# Patient Record
Sex: Female | Born: 1950
Health system: Southern US, Community
[De-identification: ages and names within clinical notes are randomized; demographics above are authoritative.]

## PROBLEM LIST (undated history)

## (undated) DIAGNOSIS — E079 Disorder of thyroid, unspecified: Secondary | ICD-10-CM

## (undated) DIAGNOSIS — I10 Essential (primary) hypertension: Secondary | ICD-10-CM

## (undated) HISTORY — PX: ABDOMINAL HYSTERECTOMY: SHX81

---

## 2013-11-09 ENCOUNTER — Emergency Department (HOSPITAL_BASED_OUTPATIENT_CLINIC_OR_DEPARTMENT_OTHER): Payer: PRIVATE HEALTH INSURANCE

## 2013-11-09 ENCOUNTER — Encounter (HOSPITAL_BASED_OUTPATIENT_CLINIC_OR_DEPARTMENT_OTHER): Payer: Self-pay | Admitting: Emergency Medicine

## 2013-11-09 ENCOUNTER — Emergency Department (HOSPITAL_BASED_OUTPATIENT_CLINIC_OR_DEPARTMENT_OTHER)
Admission: EM | Admit: 2013-11-09 | Discharge: 2013-11-09 | Disposition: A | Discharge status: Home / Self Care | Payer: PRIVATE HEALTH INSURANCE | Attending: Emergency Medicine | Admitting: Emergency Medicine

## 2013-11-09 DIAGNOSIS — R110 Nausea: Secondary | ICD-10-CM | POA: Insufficient documentation

## 2013-11-09 DIAGNOSIS — K429 Umbilical hernia without obstruction or gangrene: Secondary | ICD-10-CM | POA: Insufficient documentation

## 2013-11-09 DIAGNOSIS — R1011 Right upper quadrant pain: Secondary | ICD-10-CM | POA: Insufficient documentation

## 2013-11-09 DIAGNOSIS — E079 Disorder of thyroid, unspecified: Secondary | ICD-10-CM | POA: Insufficient documentation

## 2013-11-09 DIAGNOSIS — I10 Essential (primary) hypertension: Secondary | ICD-10-CM | POA: Insufficient documentation

## 2013-11-09 DIAGNOSIS — Z79899 Other long term (current) drug therapy: Secondary | ICD-10-CM | POA: Insufficient documentation

## 2013-11-09 HISTORY — DX: Disorder of thyroid, unspecified: E07.9

## 2013-11-09 HISTORY — DX: Essential (primary) hypertension: I10

## 2013-11-09 LAB — URINALYSIS, ROUTINE W REFLEX MICROSCOPIC
Bilirubin Urine: NEGATIVE
Glucose, UA: NEGATIVE mg/dL
Ketones, ur: NEGATIVE mg/dL
NITRITE: NEGATIVE
PH: 6.5 (ref 5.0–8.0)
Protein, ur: NEGATIVE mg/dL
Specific Gravity, Urine: 1.019 (ref 1.005–1.030)
UROBILINOGEN UA: 0.2 mg/dL (ref 0.0–1.0)

## 2013-11-09 LAB — COMPREHENSIVE METABOLIC PANEL
ALBUMIN: 4.2 g/dL (ref 3.5–5.2)
ALK PHOS: 68 U/L (ref 39–117)
ALT: 15 U/L (ref 0–35)
AST: 23 U/L (ref 0–37)
BUN: 18 mg/dL (ref 6–23)
CHLORIDE: 98 meq/L (ref 96–112)
CO2: 27 mEq/L (ref 19–32)
Calcium: 9.4 mg/dL (ref 8.4–10.5)
Creatinine, Ser: 0.7 mg/dL (ref 0.50–1.10)
GFR calc Af Amer: >90 mL/min (ref >90)
GFR calc non Af Amer: 90 mL/min — ABNORMAL LOW (ref >90)
Glucose, Bld: 132 mg/dL — ABNORMAL HIGH (ref 70–99)
POTASSIUM: 3.6 meq/L — AB (ref 3.7–5.3)
Sodium: 138 mEq/L (ref 137–147)
TOTAL PROTEIN: 7.5 g/dL (ref 6.0–8.3)
Total Bilirubin: 0.3 mg/dL (ref 0.3–1.2)

## 2013-11-09 LAB — CBC WITH DIFFERENTIAL/PLATELET
BASOS ABS: 0 10*3/uL (ref 0.0–0.1)
BASOS PCT: 0 % (ref 0–1)
EOS PCT: 2 % (ref 0–5)
Eosinophils Absolute: 0.2 10*3/uL (ref 0.0–0.7)
HEMATOCRIT: 34.7 % — AB (ref 36.0–46.0)
Hemoglobin: 11.7 g/dL — ABNORMAL LOW (ref 12.0–15.0)
Lymphocytes Relative: 39 % (ref 12–46)
Lymphs Abs: 2.9 10*3/uL (ref 0.7–4.0)
MCH: 29 pg (ref 26.0–34.0)
MCHC: 33.7 g/dL (ref 30.0–36.0)
MCV: 85.9 fL (ref 78.0–100.0)
MONO ABS: 0.4 10*3/uL (ref 0.1–1.0)
Monocytes Relative: 5 % (ref 3–12)
Neutro Abs: 4 10*3/uL (ref 1.7–7.7)
Neutrophils Relative %: 53 % (ref 43–77)
Platelets: 228 10*3/uL (ref 150–400)
RBC: 4.04 MIL/uL (ref 3.87–5.11)
RDW: 13.5 % (ref 11.5–15.5)
WBC: 7.4 10*3/uL (ref 4.0–10.5)

## 2013-11-09 LAB — URINE MICROSCOPIC-ADD ON

## 2013-11-09 LAB — LIPASE, BLOOD: Lipase: 39 U/L (ref 11–59)

## 2013-11-09 MED ORDER — ONDANSETRON HCL 4 MG/2ML IJ SOLN
4.0000 mg | Freq: Once | INTRAMUSCULAR | Status: AC
  Administered 2013-11-09: 4 mg via INTRAVENOUS
  Filled 2013-11-09: qty 2

## 2013-11-09 MED ORDER — FENTANYL CITRATE 0.05 MG/ML IJ SOLN
75.0000 ug | Freq: Once | INTRAMUSCULAR | Status: AC
  Administered 2013-11-09: 75 ug via INTRAVENOUS
  Filled 2013-11-09: qty 2

## 2013-11-09 MED ORDER — SODIUM CHLORIDE 0.9 % IV BOLUS (SEPSIS)
1000.0000 mL | Freq: Once | INTRAVENOUS | Status: AC
  Administered 2013-11-09: 1000 mL via INTRAVENOUS

## 2013-11-09 MED ORDER — ONDANSETRON 4 MG PO TBDP: ORAL_TABLET | ORAL | Status: DC

## 2013-11-09 MED ORDER — HYDROCODONE-ACETAMINOPHEN 5-325 MG PO TABS: 2.0000 | ORAL_TABLET | ORAL | Status: DC | PRN

## 2013-11-09 NOTE — ED Provider Notes (Signed)
CSN: [id number]     Arrival date & time 11/09/13  4098 History   First MD Initiated Contact with Patient 11/09/13 [phone]     Chief Complaint  Patient presents with  . Abdominal Pain   (Consider location/radiation/quality/duration/timing/severity/associated sxs/prior Treatment) HPI Comments: 63 yo female with hysterectomy, cholecystectomy, HTN hx presents with acute RUQ pain radiating to her back since midnight, nausea.  No hx of similar.  No AAA hx.  No bleeding.  No injury.  Pain constant, worse with pressing. No hernia hx.  Ache sensation. No stone hx.   Patient is a 63 y.o. female presenting with abdominal pain. The history is provided by the patient.  Abdominal Pain Associated symptoms: nausea   Associated symptoms: no chest pain, no chills, no cough, no dysuria, no fever, no shortness of breath and no vomiting     Past Medical History  Diagnosis Date  . Hypertension   . Thyroid disease    Past Surgical History  Procedure Laterality Date  . Abdominal hysterectomy     No family history on file. History  Substance Use Topics  . Smoking status: Never Smoker   . Smokeless tobacco: Not on file  . Alcohol Use: Not on file   OB History   Grav Para Term Preterm Abortions TAB SAB Ect Mult Living                 Review of Systems  Constitutional: Negative for fever and chills.  HENT: Negative for congestion.   Eyes: Negative for visual disturbance.  Respiratory: Negative for cough and shortness of breath.   Cardiovascular: Negative for chest pain.  Gastrointestinal: Positive for nausea and abdominal pain. Negative for vomiting.  Genitourinary: Negative for dysuria and flank pain.  Musculoskeletal: Negative for back pain, neck pain and neck stiffness.  Skin: Negative for rash.  Neurological: Negative for light-headedness and headaches.    Allergies  Review of patient's allergies indicates no known allergies.  Home Medications   Current Outpatient Rx  Name  Route  Sig   Dispense  Refill  . hydrochlorothiazide (HYDRODIURIL) 25 MG tablet   Oral   Take 25 mg by mouth daily.         Marland Kitchen levothyroxine (SYNTHROID, LEVOTHROID) 50 MCG tablet   Oral   Take 50 mcg by mouth daily before breakfast.          BP 161/79  Pulse 87  Temp(Src) 97.7 F (36.5 C) (Oral)  Resp 18  SpO2 100% Physical Exam  Nursing note and vitals reviewed. Constitutional: She is oriented to person, place, and time. She appears well-developed and well-nourished.  HENT:  Head: Normocephalic and atraumatic.  Eyes: Conjunctivae are normal. Right eye exhibits no discharge. Left eye exhibits no discharge.  Neck: Normal range of motion. Neck supple. No tracheal deviation present.  Cardiovascular: Normal rate and regular rhythm.   Pulmonary/Chest: Effort normal and breath sounds normal.  Abdominal: Soft. She exhibits no distension. There is tenderness (right upper quadrant and lower right rib). There is no guarding.  Musculoskeletal: She exhibits no edema.  Neurological: She is alert and oriented to person, place, and time.  Skin: Skin is warm. No rash noted.  Psychiatric: She has a normal mood and affect.    ED Course  Procedures (including critical care time) EMERGENCY DEPARTMENT ULTRASOUND  Study: Limited Retroperitoneal Ultrasound of the Abdominal Aorta.  INDICATIONS:Abdominal pain, Back pain and Age>55 Multiple views of the abdominal aorta were obtained in real-time from the diaphragmatic hiatus to  the aortic bifurcation in transverse planes with a multi-frequency probe. PERFORMED BY: Myself IMAGES ARCHIVED?: Yes FINDINGS: Maximum aortic dimensions are 1.5 cm LIMITATIONS:  Bowel gas pain INTERPRETATION:  No abdominal aortic aneurysm   Emergency Focused Ultrasound Exam Limited retroperitoneal ultrasound of kidneys  Performed and interpreted by Dr. Jodi MourningZavitz Indication: flank pain Focused abdominal ultrasound with both kidneys imaged in transverse and longitudinal planes in  real-time. Interpretation: No hydronephrosis visualized.  No stones or cysts visualized  Images archived electronically  Labs Review Labs Reviewed  URINALYSIS, ROUTINE W REFLEX MICROSCOPIC - Abnormal; Notable for the following:    Hgb urine dipstick TRACE (*)    Leukocytes, UA SMALL (*)    All other components within normal limits  COMPREHENSIVE METABOLIC PANEL - Abnormal; Notable for the following:    Potassium 3.6 (*)    Glucose, Bld 132 (*)    GFR calc non Af Amer 90 (*)    All other components within normal limits  CBC WITH DIFFERENTIAL - Abnormal; Notable for the following:    Hemoglobin 11.7 (*)    HCT 34.7 (*)    All other components within normal limits  URINE MICROSCOPIC-ADD ON - Abnormal; Notable for the following:    Bacteria, UA FEW (*)    All other components within normal limits  LIPASE, BLOOD   Imaging Review Ct Abdomen Pelvis W Contrast  11/09/2013   CLINICAL DATA:  Right upper quadrant abdominal pain radiating to the back that began yesterday. Surgical history includes cholecystectomy an hysterectomy. Current history of hypertension and unspecified thyroid disease.  EXAM: CT ABDOMEN AND PELVIS WITH CONTRAST  TECHNIQUE: Multidetector CT imaging of the abdomen and pelvis was performed using the standard protocol following bolus administration of intravenous contrast. Because of injector malfunction initially, the images are performed utilizing delayed technique rather than the usual dynamic imaging.  CONTRAST:  100 CC Omnipaque 300 IV. Oral contrast was also administered.  COMPARISON:  None.  FINDINGS: Normal appearing liver, spleen, pancreas, adrenal glands, and kidneys. Gallbladder surgically absent. No biliary ductal dilation. No visible aortoiliofemoral atherosclerosis. No significant lymphadenopathy.  Small amount of oral contrast in the distal esophagus without evidence of hiatal hernia. Normal appearing stomach and small bowel. Moderate stool burden throughout the  colon. Scattered descending and sigmoid colon and cecal diverticula without evidence of acute diverticulitis. Normal appendix in the right upper pelvis. No ascites. Very small umbilical hernia containing fat.  Uterus surgically absent. No adnexal masses or free pelvic fluid. Urinary bladder normal in appearance. Phleboliths low in both sides of the pelvis.  Bone window images unremarkable apart from mild lower thoracic spondylosis and mild facet degenerative changes at L5-S1. Visualized lung bases clear. Heart size upper normal.  IMPRESSION: 1. No acute abnormalities involving the abdomen or pelvis. 2. Scattered colonic diverticula without evidence of acute diverticulitis. 3. Oral contrast material within the visualized distal esophagus without evidence of hiatal hernia. This can be seen with gastroesophageal reflux and/or esophageal dysmotility. 4. Very small umbilical hernia containing fat.   Electronically Signed   By: Hulan Saashomas  Lawrence M.D.   On: 11/09/2013 12:34    EKG Interpretation   None       MDM   1. Umbilical hernia   2. Right upper quadrant abdominal pain    With age and acute abd pain radiating to back bedside US brought in to look for signs of AAA Or kidney stone, no hydronephrosis or AAA seen.  Pain both abdomen and lower rib.  No known  CA, no wt loss, no blood clot hx.   Plan for blood work, pain meds, fluids and CT abd pelvis.  Rechecked, pt improved, discussed results.  Pt will see her physician Monday for recheck.  Results and differential diagnosis were discussed with the patient. Close follow up outpatient was discussed, patient comfortable with the plan.   Filed Vitals:   11/09/13 0936 11/09/13 1306  BP: 161/79 108/56  Pulse: 87 71  Temp: 97.7 F (36.5 C)   TempSrc: Oral   Resp: 18 18  SpO2: 100% 100%        Enid Skeens, MD 11/09/13 1327

## 2013-11-09 NOTE — ED Notes (Signed)
Patient here with RUQ pain radiating to back since midnight, nausea without vomiting. Gallbladder previously removed.

## 2013-11-09 NOTE — Discharge Instructions (Signed)
If you were given medicines take as directed.  If you are on coumadin or contraceptives realize their levels and effectiveness is altered by many different medicines.  If you have any reaction (rash, tongues swelling, other) to the medicines stop taking and see a physician.   Please follow up as directed and return to the ER or see a physician for new or worsening symptoms.  Thank you. Take zofran as needed for nausea.  For severe pain take norco or vicodin however realize they have the potential for addiction and it can make you sleepy and has tylenol in it.  No operating machinery while taking.

## 2013-12-04 ENCOUNTER — Encounter (HOSPITAL_BASED_OUTPATIENT_CLINIC_OR_DEPARTMENT_OTHER): Payer: Self-pay | Admitting: Emergency Medicine

## 2013-12-04 ENCOUNTER — Emergency Department (HOSPITAL_BASED_OUTPATIENT_CLINIC_OR_DEPARTMENT_OTHER)
Admission: EM | Admit: 2013-12-04 | Discharge: 2013-12-04 | Disposition: A | Discharge status: Home / Self Care | Payer: Medicare Other | Attending: Emergency Medicine | Admitting: Emergency Medicine

## 2013-12-04 ENCOUNTER — Emergency Department (HOSPITAL_BASED_OUTPATIENT_CLINIC_OR_DEPARTMENT_OTHER): Payer: Medicare Other

## 2013-12-04 DIAGNOSIS — J40 Bronchitis, not specified as acute or chronic: Secondary | ICD-10-CM | POA: Insufficient documentation

## 2013-12-04 DIAGNOSIS — R5383 Other fatigue: Secondary | ICD-10-CM

## 2013-12-04 DIAGNOSIS — R05 Cough: Secondary | ICD-10-CM

## 2013-12-04 DIAGNOSIS — R1110 Vomiting, unspecified: Secondary | ICD-10-CM | POA: Insufficient documentation

## 2013-12-04 DIAGNOSIS — E079 Disorder of thyroid, unspecified: Secondary | ICD-10-CM | POA: Insufficient documentation

## 2013-12-04 DIAGNOSIS — Z79899 Other long term (current) drug therapy: Secondary | ICD-10-CM | POA: Insufficient documentation

## 2013-12-04 DIAGNOSIS — R0982 Postnasal drip: Secondary | ICD-10-CM | POA: Insufficient documentation

## 2013-12-04 DIAGNOSIS — J9801 Acute bronchospasm: Secondary | ICD-10-CM

## 2013-12-04 DIAGNOSIS — I10 Essential (primary) hypertension: Secondary | ICD-10-CM | POA: Insufficient documentation

## 2013-12-04 DIAGNOSIS — Z792 Long term (current) use of antibiotics: Secondary | ICD-10-CM | POA: Insufficient documentation

## 2013-12-04 DIAGNOSIS — R52 Pain, unspecified: Secondary | ICD-10-CM | POA: Insufficient documentation

## 2013-12-04 DIAGNOSIS — IMO0002 Reserved for concepts with insufficient information to code with codable children: Secondary | ICD-10-CM | POA: Insufficient documentation

## 2013-12-04 DIAGNOSIS — R059 Cough, unspecified: Secondary | ICD-10-CM

## 2013-12-04 DIAGNOSIS — R5381 Other malaise: Secondary | ICD-10-CM | POA: Insufficient documentation

## 2013-12-04 MED ORDER — ONDANSETRON 4 MG PO TBDP
4.0000 mg | ORAL_TABLET | Freq: Once | ORAL | Status: AC
  Administered 2013-12-04: 4 mg via ORAL
  Filled 2013-12-04: qty 1

## 2013-12-04 MED ORDER — ALBUTEROL (5 MG/ML) CONTINUOUS INHALATION SOLN
10.0000 mg/h | INHALATION_SOLUTION | Freq: Once | RESPIRATORY_TRACT | Status: AC
  Administered 2013-12-04: 10 mg/h via RESPIRATORY_TRACT
  Filled 2013-12-04: qty 20

## 2013-12-04 MED ORDER — ALBUTEROL SULFATE (2.5 MG/3ML) 0.083% IN NEBU
INHALATION_SOLUTION | RESPIRATORY_TRACT | Status: AC
  Administered 2013-12-04: 09:00:00 5 mg via RESPIRATORY_TRACT
  Filled 2013-12-04: qty 6

## 2013-12-04 MED ORDER — IPRATROPIUM BROMIDE 0.02 % IN SOLN
RESPIRATORY_TRACT | Status: AC
  Administered 2013-12-04: 09:00:00 0.5 mg via RESPIRATORY_TRACT
  Filled 2013-12-04: qty 2.5

## 2013-12-04 MED ORDER — PREDNISONE 10 MG PO TABS: 20.0000 mg | ORAL_TABLET | Freq: Every day | ORAL | Status: DC

## 2013-12-04 MED ORDER — DEXAMETHASONE SODIUM PHOSPHATE 10 MG/ML IJ SOLN
10.0000 mg | Freq: Once | INTRAMUSCULAR | Status: AC
  Administered 2013-12-04: 10 mg via INTRAVENOUS
  Filled 2013-12-04: qty 1

## 2013-12-04 MED ORDER — PHENYLEPH-PROMETHAZINE-COD 5-6.25-10 MG/5ML PO SYRP: 5.0000 mL | ORAL_SOLUTION | Freq: Four times a day (QID) | ORAL | Status: DC | PRN

## 2013-12-04 MED ORDER — HYDROCODONE-ACETAMINOPHEN 5-325 MG PO TABS
2.0000 | ORAL_TABLET | Freq: Once | ORAL | Status: AC
  Administered 2013-12-04: 2 via ORAL
  Filled 2013-12-04: qty 2

## 2013-12-04 MED ORDER — ALBUTEROL SULFATE (2.5 MG/3ML) 0.083% IN NEBU
5.0000 mg | INHALATION_SOLUTION | Freq: Once | RESPIRATORY_TRACT | Status: AC
  Administered 2013-12-04: 5 mg via RESPIRATORY_TRACT

## 2013-12-04 MED ORDER — ALBUTEROL SULFATE (2.5 MG/3ML) 0.083% IN NEBU
2.5000 mg | INHALATION_SOLUTION | RESPIRATORY_TRACT | Status: DC | PRN
  Administered 2013-12-04: 2.5 mg via RESPIRATORY_TRACT
  Filled 2013-12-04: qty 3

## 2013-12-04 MED ORDER — ALBUTEROL SULFATE (2.5 MG/3ML) 0.083% IN NEBU: 2.5000 mg | INHALATION_SOLUTION | Freq: Four times a day (QID) | RESPIRATORY_TRACT | Status: DC | PRN

## 2013-12-04 MED ORDER — IPRATROPIUM BROMIDE 0.02 % IN SOLN
0.5000 mg | Freq: Once | RESPIRATORY_TRACT | Status: AC
  Administered 2013-12-04: 0.5 mg via RESPIRATORY_TRACT

## 2013-12-04 MED ORDER — BENZONATATE 100 MG PO CAPS: 100.0000 mg | ORAL_CAPSULE | Freq: Three times a day (TID) | ORAL | Status: DC

## 2013-12-04 NOTE — ED Provider Notes (Signed)
CSN: [id number]     Arrival date & time 12/04/13  4098 History   None    Chief Complaint  Patient presents with  . Cough    HPI  Patient presents via private conveyance with her grandson. Has a ten-day illness. Started as "a cold" with occasional cough and nasal congestion and postnasal drip. Progressed to the point worsening cough with a few episodes of post tussive emesis. Seen at urgent care on Monday, 2 days ago. Started on ibuprofen, Augmentin, and Lortab for the cough. Coughing persists. No fever. She has had bodyaches and rib pain from her cough. Her cough remains dry and nonproductive. She is slept poorly because of the cough. No travel. No ill exposures.  Past Medical History  Diagnosis Date  . Hypertension   . Thyroid disease    Past Surgical History  Procedure Laterality Date  . Abdominal hysterectomy     No family history on file. History  Substance Use Topics  . Smoking status: Never Smoker   . Smokeless tobacco: Not on file  . Alcohol Use: No   OB History   Grav Para Term Preterm Abortions TAB SAB Ect Mult Living                 Review of Systems  Constitutional: Positive for fatigue. Negative for fever, chills, diaphoresis and appetite change.  HENT: Positive for postnasal drip, sinus pressure and sore throat. Negative for mouth sores and trouble swallowing.   Eyes: Negative for visual disturbance.  Respiratory: Positive for cough, shortness of breath and wheezing. Negative for chest tightness.   Cardiovascular: Negative for chest pain.  Gastrointestinal: Negative for nausea, vomiting, abdominal pain, diarrhea and abdominal distention.  Endocrine: Negative for polydipsia, polyphagia and polyuria.  Genitourinary: Negative for dysuria, frequency and hematuria.  Musculoskeletal: Negative for gait problem.  Skin: Negative for color change, pallor and rash.  Neurological: Negative for dizziness, syncope, light-headedness and headaches.  Hematological: Does not  bruise/bleed easily.  Psychiatric/Behavioral: Negative for behavioral problems and confusion.    Allergies  Review of patient's allergies indicates no known allergies.  Home Medications   Current Outpatient Rx  Name  Route  Sig  Dispense  Refill  . amoxicillin-clavulanate (AUGMENTIN) 875-125 MG per tablet   Oral   Take 1 tablet by mouth 2 (two) times daily.         . benazepril (LOTENSIN) 10 MG tablet   Oral   Take 10 mg by mouth daily.         . chlorpheniramine-carbetapentane (TUSSI-12) 4-30 MG/5ML SUSP   Oral   Take by mouth at bedtime as needed.         Marland Kitchen ibuprofen (ADVIL,MOTRIN) 800 MG tablet   Oral   Take 800 mg by mouth every 8 (eight) hours as needed.         Marland Kitchen albuterol (PROVENTIL) (2.5 MG/3ML) 0.083% nebulizer solution   Nebulization   Take 3 mLs (2.5 mg total) by nebulization every 6 (six) hours as needed for wheezing or shortness of breath.   75 mL   1   . benzonatate (TESSALON) 100 MG capsule   Oral   Take 1 capsule (100 mg total) by mouth every 8 (eight) hours.   21 capsule   0   . hydrochlorothiazide (HYDRODIURIL) 25 MG tablet   Oral   Take 25 mg by mouth daily.         Marland Kitchen HYDROcodone-acetaminophen (NORCO) 5-325 MG per tablet   Oral  Take 2 tablets by mouth every 4 (four) hours as needed.   6 tablet   0   . levothyroxine (SYNTHROID, LEVOTHROID) 50 MCG tablet   Oral   Take 50 mcg by mouth daily before breakfast.         . ondansetron (ZOFRAN ODT) 4 MG disintegrating tablet      4mg  ODT q4 hours prn nausea/vomit   4 tablet   0   . Phenyleph-Promethazine-Cod 5-6.25-10 MG/5ML SYRP   Oral   Take 5 mLs by mouth every 6 (six) hours as needed.   120 mL   0   . predniSONE (DELTASONE) 10 MG tablet   Oral   Take 2 tablets (20 mg total) by mouth daily.   10 tablet   0    BP 101/72  Pulse 104  Temp(Src) 98 F (36.7 C) (Oral)  Resp 28  SpO2 100% Physical Exam  Constitutional: She is oriented to person, place, and time. She  appears well-developed and well-nourished. No distress.  Appears to not feel well. Not frankly toxic  HENT:  Head: Normocephalic.  Eyes: Conjunctivae are normal. Pupils are equal, round, and reactive to light. No scleral icterus.  Neck: Normal range of motion. Neck supple. No thyromegaly present.  Cardiovascular: Normal rate and regular rhythm.  Exam reveals no gallop and no friction rub.   No murmur heard. Pulmonary/Chest: Effort normal. No respiratory distress. She has wheezes. She has no rales.  Use wheezing and prolongation in all fields. Almost constant dry cough  Abdominal: Soft. Bowel sounds are normal. She exhibits no distension. There is no tenderness. There is no rebound.  Musculoskeletal: Normal range of motion.  Neurological: She is alert and oriented to person, place, and time.  Skin: Skin is warm and dry. No rash noted.  Psychiatric: She has a normal mood and affect. Her behavior is normal.    ED Course  Procedures (including critical care time) Labs Review Labs Reviewed - No data to display Imaging Review Dg Chest 2 View  12/04/2013   CLINICAL DATA:  Flu-like symptoms.  EXAM: CHEST  2 VIEW  COMPARISON:  No comparisons  FINDINGS: Mediastinum and hilar structures are normal. Lungs are clear of acute infiltrates. Heart size normal. No pleural effusion or pneumothorax. Degenerative changes thoracic spine. Surgical clips right upper quadrant.  IMPRESSION: No active cardiopulmonary disease.   Electronically Signed   By: Maisie Fushomas  Register   On: 12/04/2013 09:25    EKG Interpretation   None       MDM   1. Bronchospasm   2. Cough   3. Bronchitis    Initial nebulized albuterol treatment. Given one-hour continuous nebulized albuterol. On recheck she has faint persisting and expiratory wheezing. Chest x-ray shows no focal infiltrates. She is well oxygenated. Her cough is improved. Plan will be cough suppressants. Given Tessalon and Phenergan with codeine. They have a nebulizer  at home that her grandchild uses. Given her a prescription for nebulizer solution albuterol. A five-day course prednisone.    Rolland PorterMark Michale Weikel, MD 12/04/13 1047

## 2013-12-04 NOTE — ED Notes (Signed)
Patient and son states the patient has a one week history of flu like symptoms.  States she used OTC meds with no relief.  Two days ago, she developed a cough and went to the Urgent Care and received antibiotics and cough meds.  States last night she could not breath well due to coughing.  Productive cough with yellow secretions.

## 2013-12-04 NOTE — Discharge Instructions (Signed)
Bronchospasm, Adult °A bronchospasm is when the tubes that carry air in and out of your lungs (airwarys) spasm or tighten. During a bronchospasm it is hard to breathe. This is because the airways get smaller. A bronchospasm can be triggered by: °· Allergies. These may be to animals, pollen, food, or mold. °· Infection. This is a common cause of bronchospasm. °· Exercise. °· Irritants. These include pollution, cigarette smoke, strong odors, aerosol sprays, and paint fumes. °· Weather changes. °· Stress. °· Being emotional. °HOME CARE  °· Always have a plan for getting help. Know when to call your doctor and local emergency services (911 in the [place] Know where you can get emergency care. °· Only take medicines as told by your doctor. °· If you were prescribed an inhaler or nebulizer machine, ask your doctor how to use it correctly. Always use a spacer with your inhaler if you were given one. °· Stay calm during an attack. Try to relax and breathe more slowly. °· Control your home environment: °· Change your heating and air conditioning filter at least once a month. °· Limit your use of fireplaces and wood stoves. °· Do not  smoke. Do not  allow smoking in your home. °· Avoid perfumes and fragrances. °· Get rid of pests (such as roaches and mice) and their droppings. °· Throw away plants if you see mold on them. °· Keep your house clean and dust free. °· Replace carpet with wood, tile, or vinyl flooring. Carpet can trap dander and dust. °· Use allergy-proof pillows, mattress covers, and box spring covers. °· Wash bed sheets and blankets every week in hot water. Dry them in a dryer. °· Use blankets that are made of polyester or cotton. °· Wash hands frequently. °GET HELP IF: °· You have muscle aches. °· You have chest pain. °· The thick spit you spit or cough up (sputum) changes from clear or white to yellow, green, gray, or bloody. °· The thick spit you spit or cough up gets thicker. °· There are problems that may be  related to the medicine you are given such as: °· A rash. °· Itching. °· Swelling. °· Trouble breathing. °GET HELP RIGHT AWAY IF: °· You feel you cannot breathe or catch your breath. °· You cannot stop coughing. °· Your treatment is not helping you breathe better. °MAKE SURE YOU:  °· Understand these instructions. °· Will watch your condition. °· Will get help right away if you are not doing well or get worse. °Document Released: 08/14/2009 Document Revised: 06/19/2013 Document Reviewed: 04/09/2013 °[employer]® Patient Information ©2014 [employer]. ° °

## 2015-02-01 IMAGING — CT CT ABD-PELV W/ CM
2 of 5 series · 15 of 46 positions shown, 17 images · IV contrast (APPLIED)
Comparison: None.

CLINICAL DATA: Right upper quadrant abdominal pain radiating to the
back that began yesterday. Surgical history includes cholecystectomy
an hysterectomy. Current history of hypertension and unspecified
thyroid disease.

EXAM:
CT ABDOMEN AND PELVIS WITH CONTRAST
TECHNIQUE: Multidetector CT imaging of the abdomen and pelvis was performed
using the standard protocol following bolus administration of
intravenous contrast. Because of injector malfunction initially, the
images are performed utilizing delayed technique rather than the
usual dynamic imaging.
CONTRAST:  100 CC Omnipaque 300 IV. Oral contrast was also
administered.

[Series 3: abd/pelvis 5.0 b31f · axial · 0.78mm/px · z∈[-604,-194]mm · 12 of 92 slices shown, 14 images]
[im 5/92  soft-tissue]
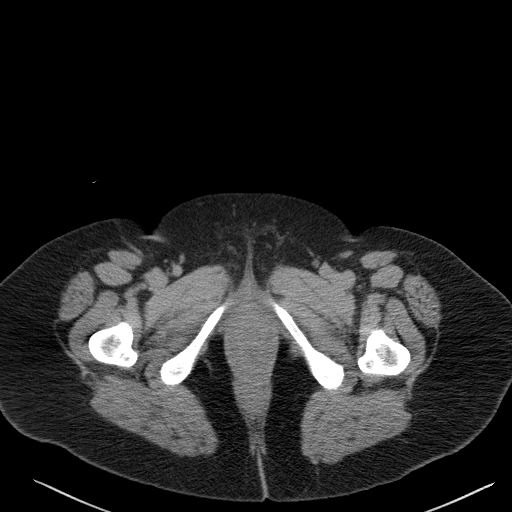
[im 5/92  bone]
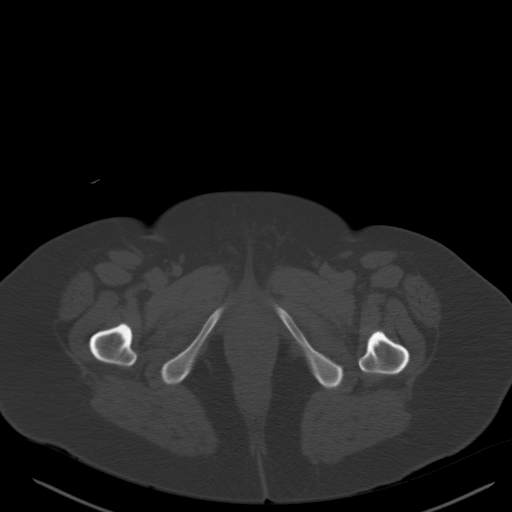
[im 14/92  soft-tissue]
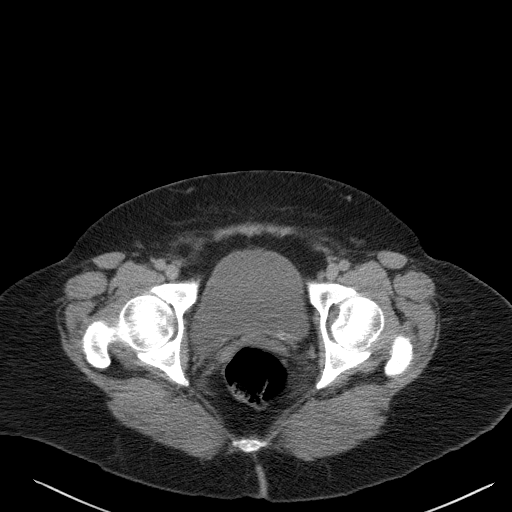
[im 22/92  soft-tissue]
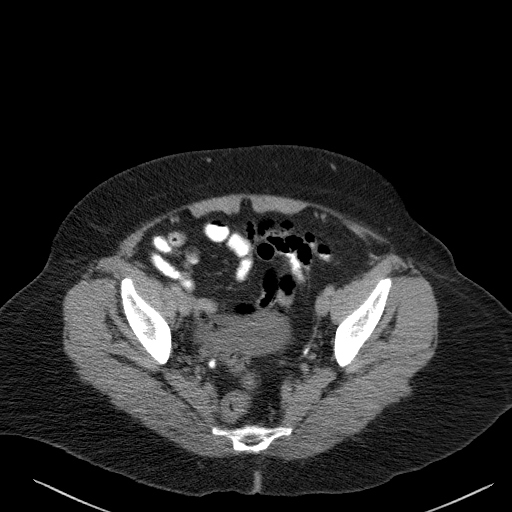
[im 27/92  soft-tissue]
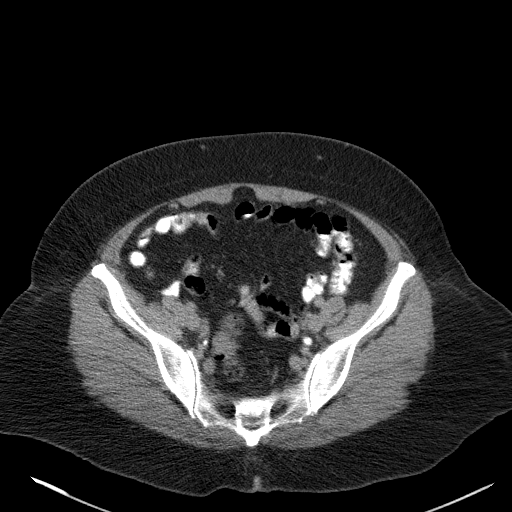
[im 35/92  soft-tissue]
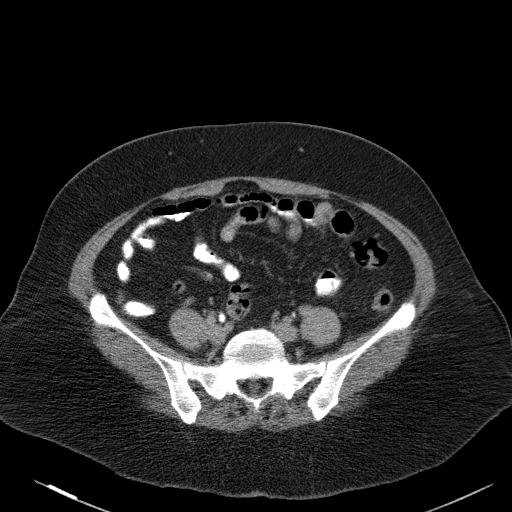
[im 44/92  soft-tissue]
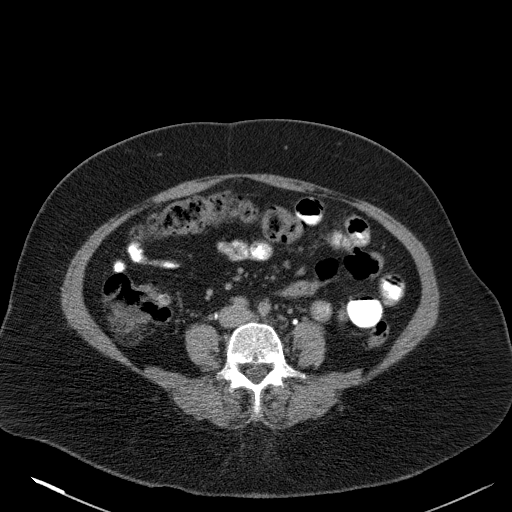
[im 48/92  soft-tissue]
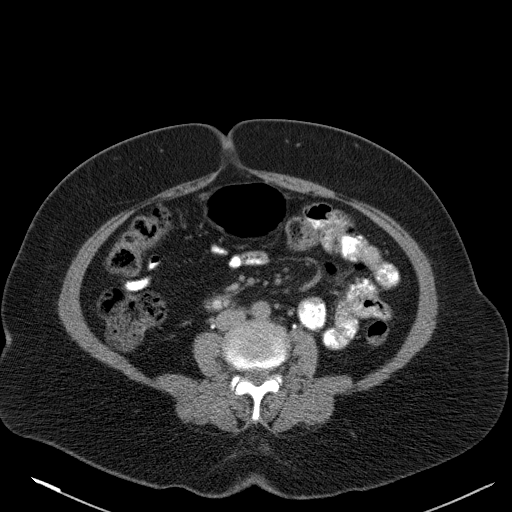
[im 57/92  soft-tissue]
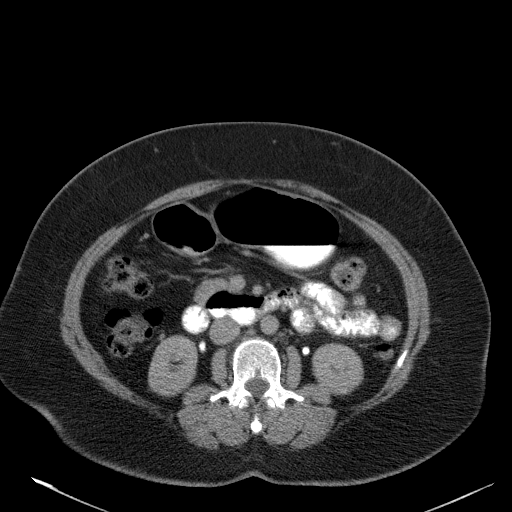
[im 66/92  soft-tissue]
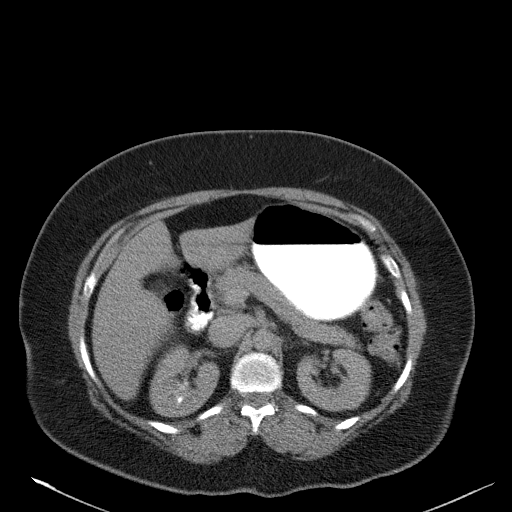
[im 66/92  bone]
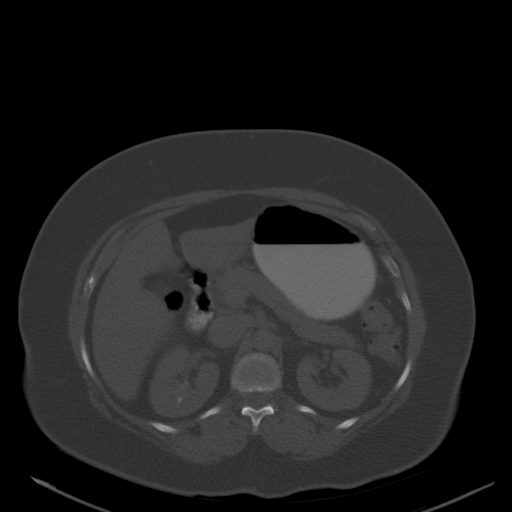
[im 70/92  soft-tissue]
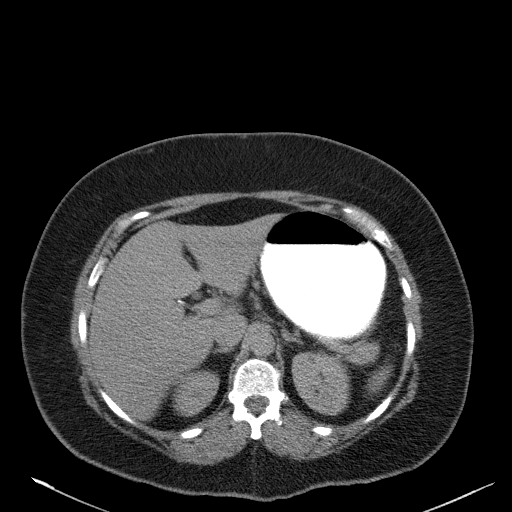
[im 79/92  soft-tissue]
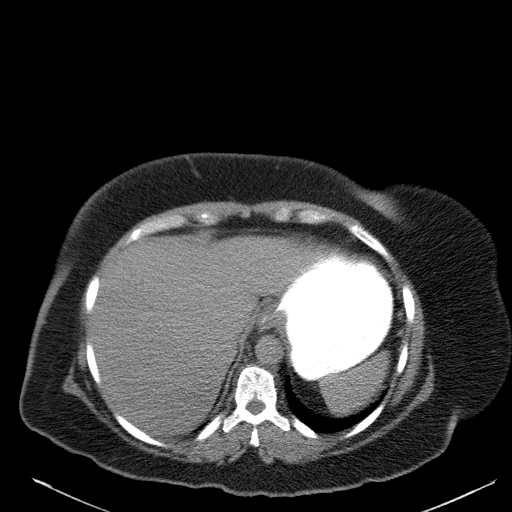
[im 87/92  soft-tissue]
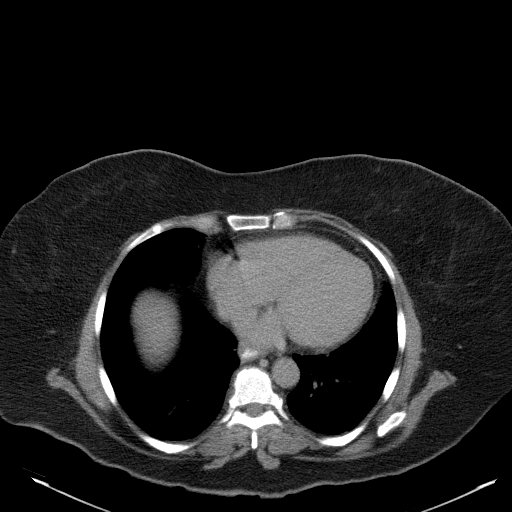

[Series 6: abd/pelvis 3.0 coronal · coronal · 0.90mm/px · 3 of 77 slices shown]
[im 26/77  soft-tissue]
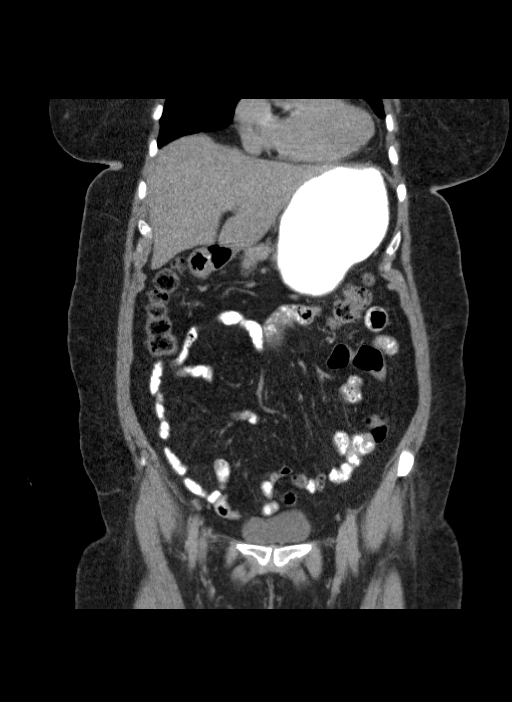
[im 34/77  soft-tissue]
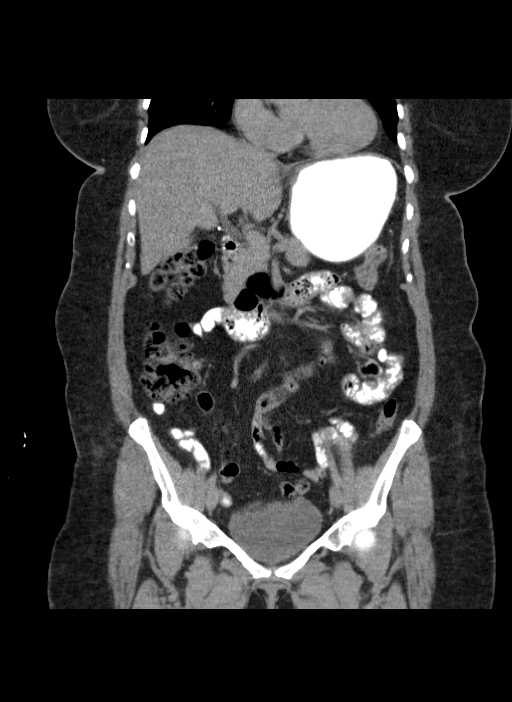
[im 43/77  soft-tissue]
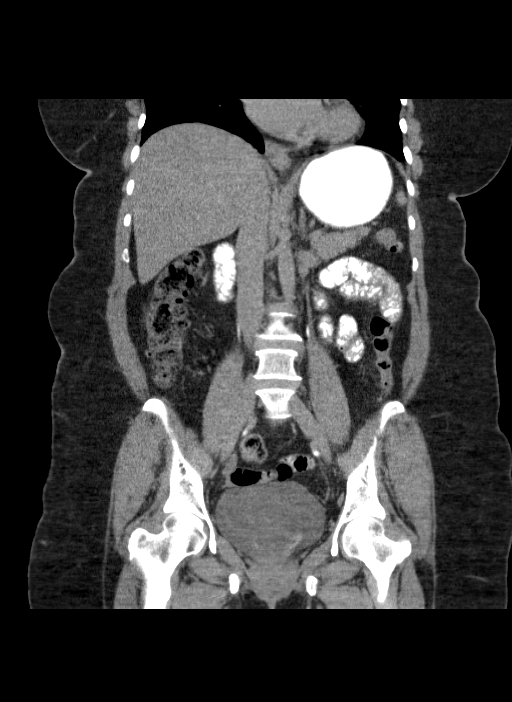

[15 of 46 positions shown; findings below may reference images not displayed]

FINDINGS: Normal appearing liver, spleen, pancreas, adrenal glands, and
kidneys. Gallbladder surgically absent. No biliary ductal dilation.
No visible aortoiliofemoral atherosclerosis. No significant
lymphadenopathy.

Small amount of oral contrast in the distal esophagus without
evidence of hiatal hernia. Normal appearing stomach and small bowel.
Moderate stool burden throughout the colon. Scattered descending and
sigmoid colon and cecal diverticula without evidence of acute
diverticulitis. Normal appendix in the right upper pelvis. No
ascites. Very small umbilical hernia containing fat.

Uterus surgically absent. No adnexal masses or free pelvic fluid.
Urinary bladder normal in appearance. Phleboliths low in both sides
of the pelvis.

Bone window images unremarkable apart from mild lower thoracic
spondylosis and mild facet degenerative changes at L5-S1. Visualized
lung bases clear. Heart size upper normal.
IMPRESSION: 1. No acute abnormalities involving the abdomen or pelvis.
2. Scattered colonic diverticula without evidence of acute
diverticulitis.
3. Oral contrast material within the visualized distal esophagus
without evidence of hiatal hernia. This can be seen with
gastroesophageal reflux and/or esophageal dysmotility.
4. Very small umbilical hernia containing fat.

## 2016-02-25 DIAGNOSIS — N993 Prolapse of vaginal vault after hysterectomy: Secondary | ICD-10-CM

## 2019-10-01 DIAGNOSIS — E038 Other specified hypothyroidism: Secondary | ICD-10-CM | POA: Insufficient documentation

## 2019-10-01 DIAGNOSIS — I10 Essential (primary) hypertension: Secondary | ICD-10-CM | POA: Insufficient documentation

## 2019-10-01 DIAGNOSIS — E039 Hypothyroidism, unspecified: Secondary | ICD-10-CM | POA: Insufficient documentation

## 2019-11-29 DIAGNOSIS — E663 Overweight: Secondary | ICD-10-CM | POA: Insufficient documentation

## 2019-12-08 DIAGNOSIS — E559 Vitamin D deficiency, unspecified: Secondary | ICD-10-CM | POA: Insufficient documentation

## 2020-06-05 ENCOUNTER — Encounter (HOSPITAL_BASED_OUTPATIENT_CLINIC_OR_DEPARTMENT_OTHER): Payer: Self-pay | Admitting: Emergency Medicine

## 2020-06-05 ENCOUNTER — Emergency Department (HOSPITAL_BASED_OUTPATIENT_CLINIC_OR_DEPARTMENT_OTHER)
Admission: EM | Admit: 2020-06-05 | Discharge: 2020-06-05 | Disposition: A | Discharge status: Home / Self Care | Payer: Medicare Other | Attending: Emergency Medicine | Admitting: Emergency Medicine

## 2020-06-05 ENCOUNTER — Other Ambulatory Visit: Payer: Self-pay

## 2020-06-05 ENCOUNTER — Emergency Department (HOSPITAL_BASED_OUTPATIENT_CLINIC_OR_DEPARTMENT_OTHER): Payer: Medicare Other

## 2020-06-05 DIAGNOSIS — I10 Essential (primary) hypertension: Secondary | ICD-10-CM | POA: Diagnosis not present

## 2020-06-05 DIAGNOSIS — M546 Pain in thoracic spine: Secondary | ICD-10-CM | POA: Diagnosis not present

## 2020-06-05 DIAGNOSIS — R531 Weakness: Secondary | ICD-10-CM | POA: Insufficient documentation

## 2020-06-05 DIAGNOSIS — M25519 Pain in unspecified shoulder: Secondary | ICD-10-CM | POA: Diagnosis not present

## 2020-06-05 DIAGNOSIS — Z79899 Other long term (current) drug therapy: Secondary | ICD-10-CM | POA: Diagnosis not present

## 2020-06-05 DIAGNOSIS — Z7951 Long term (current) use of inhaled steroids: Secondary | ICD-10-CM | POA: Insufficient documentation

## 2020-06-05 DIAGNOSIS — E039 Hypothyroidism, unspecified: Secondary | ICD-10-CM | POA: Insufficient documentation

## 2020-06-05 LAB — CBC WITH DIFFERENTIAL/PLATELET
Abs Immature Granulocytes: 0.02 10*3/uL (ref 0.00–0.07)
Basophils Absolute: 0 10*3/uL (ref 0.0–0.1)
Basophils Relative: 1 %
Eosinophils Absolute: 0.1 10*3/uL (ref 0.0–0.5)
Eosinophils Relative: 2 %
HCT: 38.1 % (ref 36.0–46.0)
Hemoglobin: 12.7 g/dL (ref 12.0–15.0)
Immature Granulocytes: 0 %
Lymphocytes Relative: 34 %
Lymphs Abs: 2 10*3/uL (ref 0.7–4.0)
MCH: 29.8 pg (ref 26.0–34.0)
MCHC: 33.3 g/dL (ref 30.0–36.0)
MCV: 89.4 fL (ref 80.0–100.0)
Monocytes Absolute: 0.4 10*3/uL (ref 0.1–1.0)
Monocytes Relative: 7 %
Neutro Abs: 3.3 10*3/uL (ref 1.7–7.7)
Neutrophils Relative %: 56 %
Platelets: 233 10*3/uL (ref 150–400)
RBC: 4.26 MIL/uL (ref 3.87–5.11)
RDW: 14.1 % (ref 11.5–15.5)
WBC: 5.9 10*3/uL (ref 4.0–10.5)
nRBC: 0 % (ref 0.0–0.2)

## 2020-06-05 LAB — BASIC METABOLIC PANEL
Anion gap: 9 (ref 5–15)
BUN: 14 mg/dL (ref 8–23)
CO2: 25 mmol/L (ref 22–32)
Calcium: 9.2 mg/dL (ref 8.9–10.3)
Chloride: 104 mmol/L (ref 98–111)
Creatinine, Ser: 0.7 mg/dL (ref 0.44–1.00)
GFR calc Af Amer: >60 mL/min (ref >60)
GFR calc non Af Amer: >60 mL/min (ref >60)
Glucose, Bld: 104 mg/dL — ABNORMAL HIGH (ref 70–99)
Potassium: 4 mmol/L (ref 3.5–5.1)
Sodium: 138 mmol/L (ref 135–145)

## 2020-06-05 LAB — TROPONIN I (HIGH SENSITIVITY): Troponin I (High Sensitivity): 5 ng/L (ref <18)

## 2020-06-05 MED ORDER — LOSARTAN POTASSIUM 50 MG PO TABS: 50.0000 mg | ORAL_TABLET | Freq: Two times a day (BID) | ORAL | 0 refills | Status: DC

## 2020-06-05 MED FILL — LOSARTAN POTASSIUM 50 MG TA: 50 | 15 days supply | Qty: 30 | Fill #0

## 2020-06-05 NOTE — ED Provider Notes (Signed)
[facility] EMERGENCY DEPARTMENT Provider Note   CSN: [id number] Arrival date & time: 06/05/20  1342     History Chief Complaint  Patient presents with  . Hypertension  . Shoulder Pain    Hannah Rubio is a 69 y.o. female.  Patient with history of hypertension currently on losartan 50 mg and Toprol-XL 25 mg, hypothyroidism, no previous cardiac history --presents to the emergency department today for elevated blood pressure readings.  Patient states that over the past couple of days, including this morning, she did not feel like herself.  She said that her head "did not feel good".  She denies explicit headache, dizziness, lightheadedness, or syncope.  She took her blood pressure and the top number was 220.  She ate breakfast and laid down.  When she got up this later this morning she checked her blood pressure again and it was 217/139.  This caused her concern and prompted emergency department visit.  She states that her last PCP visit, she was told her blood pressure was still high (183/89).  Did not make any changes to her medications at that time.  She states that she is due for another appointment later this month.  Patient specifically denies any chest pain abdominal pain.  No weakness, numbness, or tingling in the arms of the legs.  She states that she has had ongoing pain between her shoulder blades over the past several months.  This pain is worse with movement and better when she applies a heating pad.  Currently she has minimal pain in this area.         Past Medical History:  Diagnosis Date  . Hypertension   . Thyroid disease     There are no problems to display for this patient.   Past Surgical History:  Procedure Laterality Date  . ABDOMINAL HYSTERECTOMY       OB History   No obstetric history on file.     No family history on file.  Social History   Tobacco Use  . Smoking status: Never Smoker  . Smokeless tobacco: Never Used  Substance Use  Topics  . Alcohol use: No  . Drug use: No    Home Medications Prior to Admission medications   Medication Sig Start Date End Date Taking? Authorizing Provider  albuterol (PROVENTIL) (2.5 MG/3ML) 0.083% nebulizer solution Take 3 mLs (2.5 mg total) by nebulization every 6 (six) hours as needed for wheezing or shortness of breath. 12/04/13   Rolland PorterJames, Mark, MD  amoxicillin-clavulanate (AUGMENTIN) 875-125 MG per tablet Take 1 tablet by mouth 2 (two) times daily.    [provider]  benazepril (LOTENSIN) 10 MG tablet Take 10 mg by mouth daily.    [provider]  benzonatate (TESSALON) 100 MG capsule Take 1 capsule (100 mg total) by mouth every 8 (eight) hours. 12/04/13   Rolland PorterJames, Mark, MD  chlorpheniramine-carbetapentane (TUSSI-12) 4-30 MG/5ML SUSP Take by mouth at bedtime as needed.    [provider]  hydrochlorothiazide (HYDRODIURIL) 25 MG tablet Take 25 mg by mouth daily.    [provider]  HYDROcodone-acetaminophen (NORCO) 5-325 MG per tablet Take 2 tablets by mouth every 4 (four) hours as needed. 11/09/13   Blane OharaZavitz, Diahann Guajardo, MD  ibuprofen (ADVIL,MOTRIN) 800 MG tablet Take 800 mg by mouth every 8 (eight) hours as needed.    [provider]  levothyroxine (SYNTHROID, LEVOTHROID) 50 MCG tablet Take 50 mcg by mouth daily before breakfast.    [provider]  ondansetron (ZOFRAN ODT) 4 MG disintegrating tablet 4mg  ODT q4 hours prn nausea/vomit 11/09/13   01/07/14, MD  Phenyleph-Promethazine-Cod 5-6.25-10 MG/5ML SYRP Take 5 mLs by mouth every 6 (six) hours as needed. 12/04/13   02/01/14, MD  predniSONE (DELTASONE) 10 MG tablet Take 2 tablets (20 mg total) by mouth daily. 12/04/13   02/01/14, MD    Allergies    Patient has no known allergies.  Review of Systems   Review of Systems  Constitutional: Negative for fever.  HENT: Negative for rhinorrhea and sore throat.   Eyes: Negative for redness.  Respiratory: Negative for cough and shortness of  breath.   Cardiovascular: Negative for chest pain and leg swelling.  Gastrointestinal: Negative for abdominal pain, diarrhea, nausea and vomiting.  Genitourinary: Negative for dysuria, frequency, hematuria and urgency.  Musculoskeletal: Positive for back pain. Negative for myalgias.  Skin: Negative for rash.  Neurological: Positive for weakness (generalized). Negative for dizziness, syncope, facial asymmetry, speech difficulty, light-headedness, numbness and headaches.    Physical Exam Updated Vital Signs BP (!) 190/86 (BP Location: Right Arm)   Pulse 73   Temp 98.3 F (36.8 C) (Oral)   Resp 18   Ht 5\' 3"  (1.6 m)   Wt 70.3 kg   SpO2 100%   BMI 27.46 kg/m   Physical Exam Vitals and nursing note reviewed.  Constitutional:      Appearance: She is well-developed. She is not diaphoretic.  HENT:     Head: Normocephalic and atraumatic.     Mouth/Throat:     Mouth: Mucous membranes are not dry.  Eyes:     Conjunctiva/sclera: Conjunctivae normal.  Neck:     Vascular: Normal carotid pulses. No carotid bruit or JVD.     Trachea: Trachea normal. No tracheal deviation.  Cardiovascular:     Rate and Rhythm: Normal rate and regular rhythm.     Pulses: No decreased pulses.     Heart sounds: Normal heart sounds, S1 normal and S2 normal. No murmur heard.   Pulmonary:     Effort: Pulmonary effort is normal. No respiratory distress.     Breath sounds: No wheezing.  Chest:     Chest wall: No tenderness.  Abdominal:     General: Bowel sounds are normal.     Palpations: Abdomen is soft.     Tenderness: There is no abdominal tenderness. There is no guarding or rebound.  Musculoskeletal:        General: No tenderness. Normal range of motion.     Cervical back: Normal range of motion and neck supple. No muscular tenderness.     Comments: No significant tenderness to palpation of back, specifically in shoulder blade area.   Skin:    General: Skin is warm and dry.     Coloration: Skin is  not pale.  Neurological:     Mental Status: She is alert.     ED Results / Procedures / Treatments   Labs (all labs ordered are listed, but only abnormal results are displayed) Labs Reviewed  BASIC METABOLIC PANEL - Abnormal; Notable for the following components:      Result Value   Glucose, Bld 104 (*)    All other components within normal limits  CBC WITH DIFFERENTIAL/PLATELET  TROPONIN I (HIGH SENSITIVITY)    EKG EKG Interpretation  Date/Time:  Friday June 05 2020 13:56:22 EDT Ventricular Rate:  70 PR Interval:  142 QRS Duration: 76 QT Interval:  370 QTC Calculation: 399 R Axis:  33 Text Interpretation: Normal sinus rhythm Low voltage QRS No previous tracing Confirmed by Cathren Laine [phone]) on 06/05/2020 2:01:40 PM   Radiology DG Chest 2 View  Result Date: 06/05/2020 CLINICAL DATA:  Hypertension with chest pain EXAM: CHEST - 2 VIEW COMPARISON:  December 04, 2013 FINDINGS: Lungs are clear. Heart size and pulmonary vascularity are normal. No adenopathy. There is slight degenerative change in the thoracic spine. IMPRESSION: Lungs clear.  Cardiac silhouette within normal limits. Electronically Signed   By: Bretta Bang III M.D.   On: 06/05/2020 14:47    Procedures Procedures (including critical care time)  Medications Ordered in ED Medications - No data to display  ED Course  I have reviewed the triage vital signs and the nursing notes.  Pertinent labs & imaging results that were available during my care of the patient were reviewed by me and considered in my medical decision making (see chart for details).  Patient seen and examined. Work-up initiated. EKG personally reviewed. Reviewed most recent BP meds from office visit documentation 11/29/19.   Patient c/c seems to be related to her elevated BP readings and feeling generally unwell. She downplays the back/shoulder blade symptoms when we discuss. In certain scenarios this would be concerning, however in this  case, the pain has been ongoing for weeks/months, waxing and waning, not severe and she does not have other sx concerning for dissection including stroke sx, arm leg weakness or pulse deficits, abdominal pain.   Patient was discussed with Dr. Denton Lank who has seen patient. He agrees that dissection work-up not felt indicated at this time. CXR reviewed, normal mediastinum.   Vital signs reviewed and are as follows: BP (!) 190/86 (BP Location: Right Arm)   Pulse 73   Temp 98.3 F (36.8 C) (Oral)   Resp 18   Ht 5\' 3"  (1.6 m)   Wt 70.3 kg   SpO2 100%   BMI 27.46 kg/m   3:54 PM Pt stable. BP 170 systolic.   Patient instructed to take her losartan dose once in the morning and once in the evening.  We discussed that ultimately her primary care doctor will make a final decision regarding blood pressure management and this may change in the future.  Patient verbalizes understanding agrees with plan.  I discussed with patient and family at bedside, her symptoms are not overall suspicious for a dissection today.  If she develops severe pain in her back, chest, or has worsening of current symptoms --she needs to return the emergency department recheck.  Strongly encourage keeping her primary care appointment upcoming in the next couple of weeks.    MDM Rules/Calculators/A&P                          HTN: Patient without evidence of endorgan damage today.  Patient is currently on metoprolol and losartan.  Adjustments as above.  Patient reports blood pressures at home and at the 200s.  At last clinic visit in January she was in the 180s systolic.  Back pain: Seems musculoskeletal in nature.  Dissection considered, however history and progression of symptoms are not suggestive of aortic dissection.  Do not feel that patient requires advanced imaging at this point.  Mediastinum appears normal chest x-ray.  Cardiac work-up today is normal.  Patient looks well.  She is not really complaining of much pain here  in emergency department today.   Final Clinical Impression(s) / ED Diagnoses Final diagnoses:  Essential hypertension  Midline thoracic back pain, unspecified chronicity    Rx / DC Orders ED Discharge Orders         Ordered    losartan (COZAAR) 50 MG tablet  2 times daily     Discontinue  Reprint     06/05/20 1555           Renne Crigler, PA-C 06/05/20 1601    Cathren Laine, MD 06/08/20 1432

## 2020-06-05 NOTE — Discharge Instructions (Signed)
Please read and follow all provided instructions.  Your diagnoses today include:  1. Essential hypertension   2. Midline thoracic back pain, unspecified chronicity     Tests performed today include:  An EKG of your heart  A chest x-ray  Cardiac enzymes - a blood test for heart muscle damage  Blood counts and electrolytes  Vital signs. See below for your results today.   Medications prescribed:   Losartan - for now, please increase dose to 1 tablet in the morning and 1 in the evening.  As we discussed, you need to follow-up with your primary doctor regarding your blood pressure to decide what regimen is best for you in the future.  Take any prescribed medications only as directed.  Follow-up instructions: Please follow-up with your primary care provider for further evaluation of your symptoms.   Return instructions:  SEEK IMMEDIATE MEDICAL ATTENTION IF:  You have severe chest pain, especially if the pain is crushing or pressure-like and spreads to the arms, back, neck, or jaw, or if you have sweating, nausea (feeling sick to your stomach), or shortness of breath. THIS IS AN EMERGENCY. Don't wait to see if the pain will go away. Get medical help at once. Call 911 or 0 (operator). DO NOT drive yourself to the hospital.   You have an attack of chest pain lasting longer than usual, despite rest and treatment with the medications your caregiver has prescribed.   You wake from sleep with chest pain or shortness of breath.  You feel dizzy or faint.  You have chest pain not typical of your usual pain for which you originally saw your caregiver.   You have any other emergent concerns regarding your health.  Your vital signs today were: BP (!) 170/82   Pulse 61   Temp 98.3 F (36.8 C) (Oral)   Resp 16   Ht 5\' 3"  (1.6 m)   Wt 70.3 kg   SpO2 99%   BMI 27.46 kg/m  If your blood pressure (BP) was elevated above 135/85 this visit, please have this repeated by your doctor within  one month. --------------

## 2020-06-05 NOTE — ED Triage Notes (Signed)
Reports bp was elevated this morning.  Took bp meds and ate breakfast.  Rechecked around 1130 and continued to be high.  Also c/o pain between shoulder blades.  Reports this has been intermittent for 2-3 months.

## 2021-08-28 IMAGING — CR DG CHEST 2V
2 series · 2 of 2 positions shown · non-contrast
Comparison: December 04, 2013

CLINICAL DATA: Hypertension with chest pain

EXAM:
CHEST - 2 VIEW

[w chest pa]
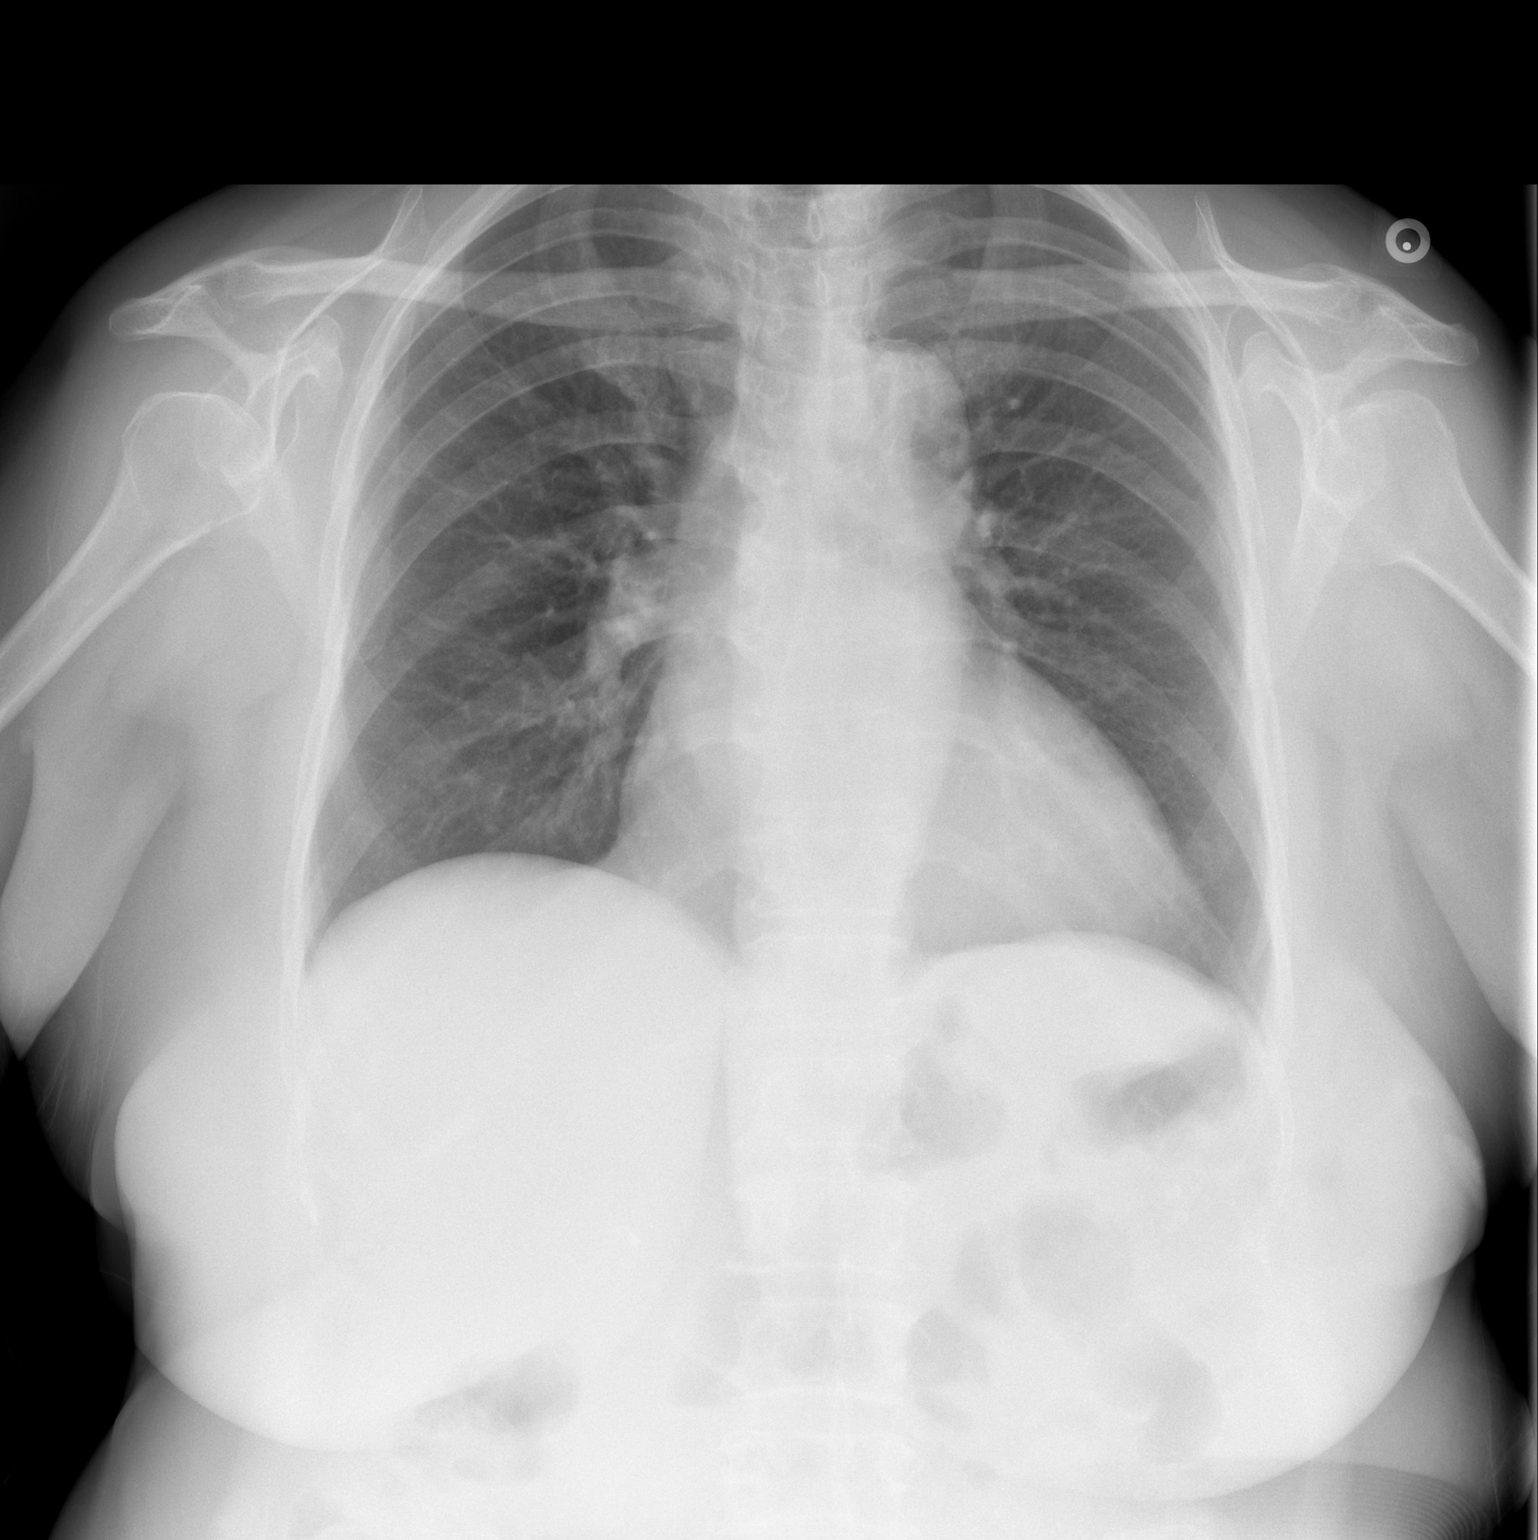

[w chest lat]
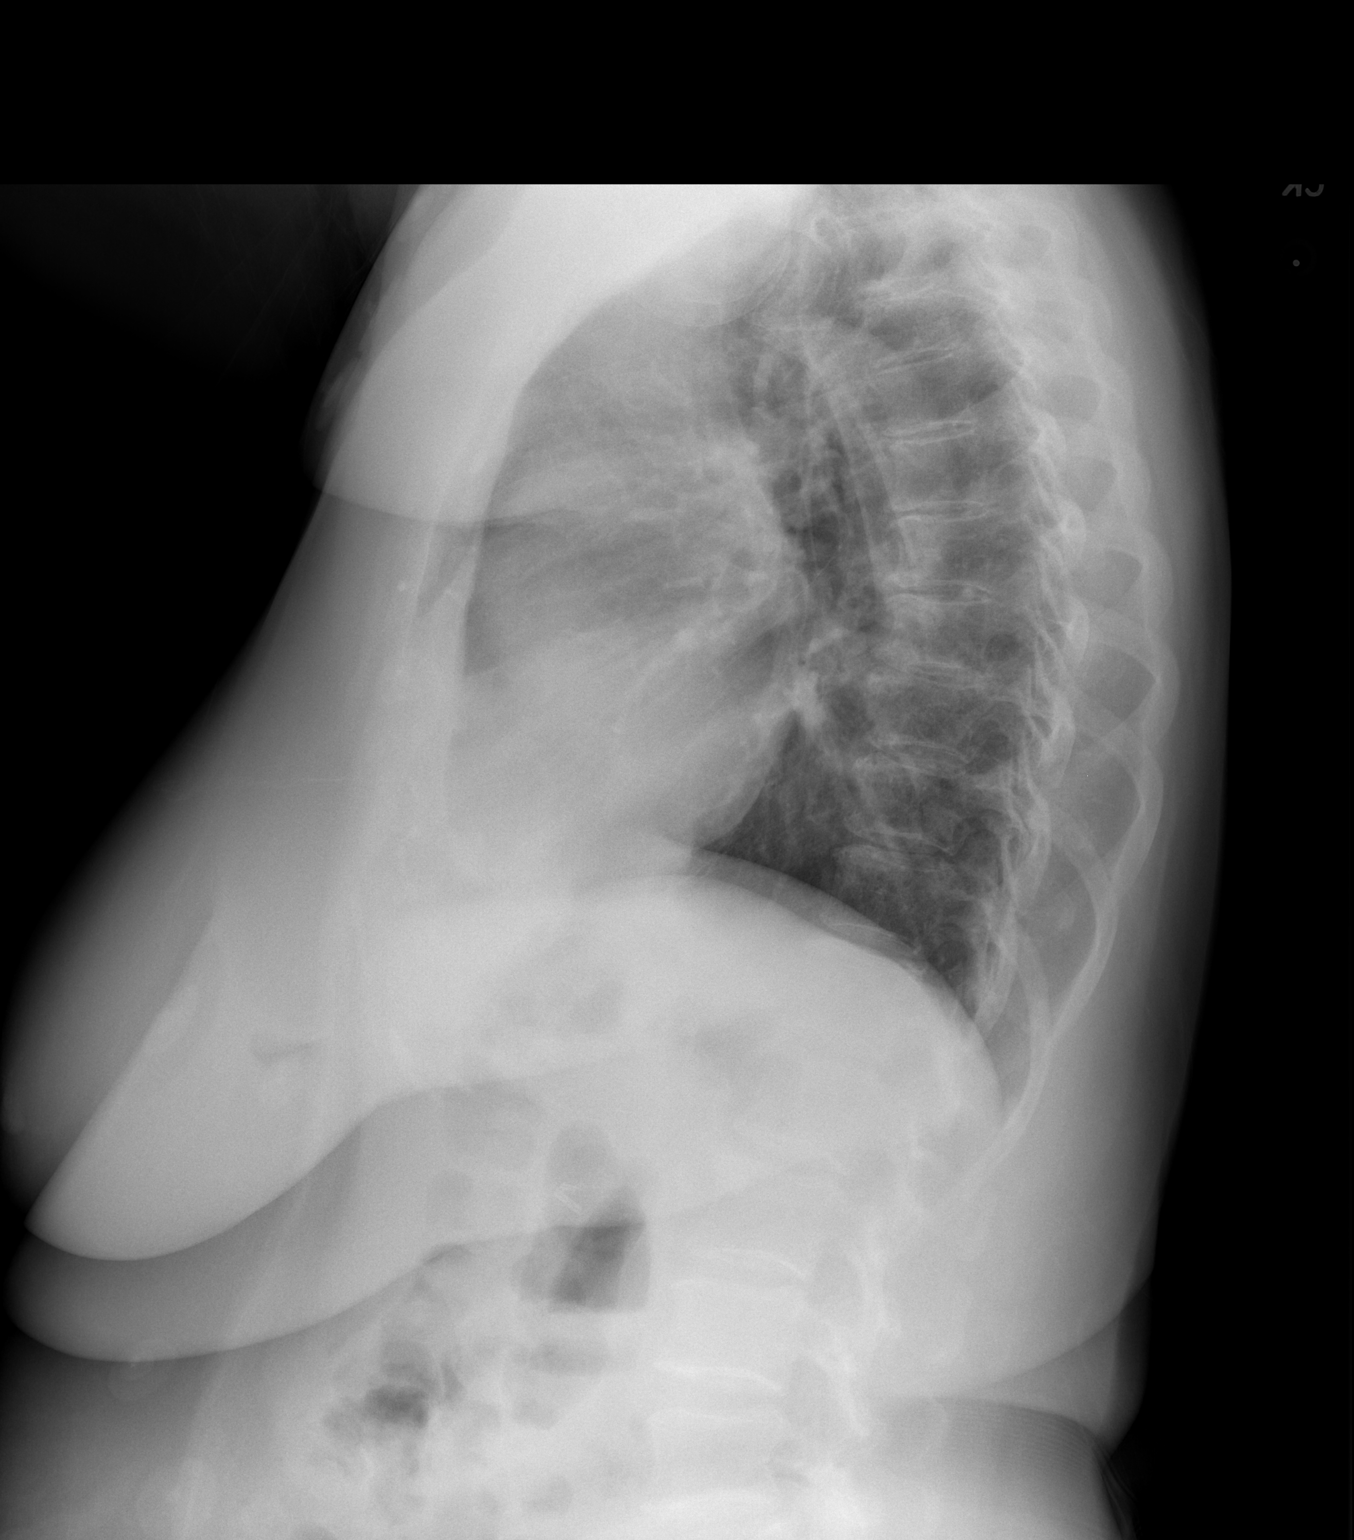

[2 of 2 positions shown; findings below may reference images not displayed]

FINDINGS: Lungs are clear. Heart size and pulmonary vascularity are normal. No
adenopathy. There is slight degenerative change in the thoracic
spine.
IMPRESSION: Lungs clear.  Cardiac silhouette within normal limits.

## 2022-02-21 DIAGNOSIS — I10 Essential (primary) hypertension: Secondary | ICD-10-CM | POA: Diagnosis not present

## 2022-02-21 DIAGNOSIS — E782 Mixed hyperlipidemia: Secondary | ICD-10-CM | POA: Diagnosis not present

## 2022-02-21 DIAGNOSIS — J301 Allergic rhinitis due to pollen: Secondary | ICD-10-CM | POA: Diagnosis not present

## 2022-02-21 DIAGNOSIS — E038 Other specified hypothyroidism: Secondary | ICD-10-CM | POA: Diagnosis not present

## 2022-02-21 DIAGNOSIS — E559 Vitamin D deficiency, unspecified: Secondary | ICD-10-CM | POA: Diagnosis not present

## 2022-05-10 DIAGNOSIS — U071 COVID-19: Secondary | ICD-10-CM | POA: Diagnosis not present

## 2022-05-10 DIAGNOSIS — J029 Acute pharyngitis, unspecified: Secondary | ICD-10-CM | POA: Diagnosis not present

## 2022-05-10 DIAGNOSIS — I10 Essential (primary) hypertension: Secondary | ICD-10-CM | POA: Diagnosis not present

## 2022-06-03 DIAGNOSIS — R5383 Other fatigue: Secondary | ICD-10-CM | POA: Diagnosis not present

## 2022-06-03 DIAGNOSIS — R7302 Impaired glucose tolerance (oral): Secondary | ICD-10-CM | POA: Diagnosis not present

## 2022-06-03 DIAGNOSIS — E782 Mixed hyperlipidemia: Secondary | ICD-10-CM | POA: Diagnosis not present

## 2022-06-03 DIAGNOSIS — I10 Essential (primary) hypertension: Secondary | ICD-10-CM | POA: Diagnosis not present

## 2022-06-26 DIAGNOSIS — R051 Acute cough: Secondary | ICD-10-CM | POA: Diagnosis not present

## 2022-06-26 DIAGNOSIS — J189 Pneumonia, unspecified organism: Secondary | ICD-10-CM | POA: Diagnosis not present

## 2022-06-26 DIAGNOSIS — J90 Pleural effusion, not elsewhere classified: Secondary | ICD-10-CM | POA: Diagnosis not present

## 2022-07-05 DIAGNOSIS — J301 Allergic rhinitis due to pollen: Secondary | ICD-10-CM | POA: Diagnosis not present

## 2022-07-05 DIAGNOSIS — I10 Essential (primary) hypertension: Secondary | ICD-10-CM | POA: Diagnosis not present

## 2022-07-05 DIAGNOSIS — L309 Dermatitis, unspecified: Secondary | ICD-10-CM | POA: Diagnosis not present

## 2022-07-05 DIAGNOSIS — E039 Hypothyroidism, unspecified: Secondary | ICD-10-CM | POA: Diagnosis not present

## 2022-07-05 DIAGNOSIS — G3184 Mild cognitive impairment, so stated: Secondary | ICD-10-CM | POA: Diagnosis not present

## 2022-07-05 DIAGNOSIS — K219 Gastro-esophageal reflux disease without esophagitis: Secondary | ICD-10-CM | POA: Diagnosis not present

## 2022-07-05 DIAGNOSIS — M1990 Unspecified osteoarthritis, unspecified site: Secondary | ICD-10-CM | POA: Diagnosis not present

## 2022-07-05 DIAGNOSIS — Z79899 Other long term (current) drug therapy: Secondary | ICD-10-CM | POA: Diagnosis not present

## 2022-07-15 ENCOUNTER — Other Ambulatory Visit: Payer: Self-pay | Admitting: Internal Medicine

## 2022-07-15 ENCOUNTER — Ambulatory Visit
Admission: RE | Admit: 2022-07-15 | Discharge: 2022-07-15 | Disposition: A | Discharge status: Home / Self Care | Payer: Medicare HMO | Source: Ambulatory Visit | Attending: Internal Medicine | Admitting: Internal Medicine

## 2022-07-15 ENCOUNTER — Ambulatory Visit
Admission: RE | Admit: 2022-07-15 | Discharge: 2022-07-15 | Disposition: A | Discharge status: Home / Self Care | Payer: Medicare HMO | Attending: Internal Medicine | Admitting: Internal Medicine

## 2022-07-15 DIAGNOSIS — J189 Pneumonia, unspecified organism: Secondary | ICD-10-CM | POA: Diagnosis not present

## 2022-07-15 DIAGNOSIS — I10 Essential (primary) hypertension: Secondary | ICD-10-CM | POA: Diagnosis not present

## 2022-07-15 DIAGNOSIS — J159 Unspecified bacterial pneumonia: Secondary | ICD-10-CM | POA: Diagnosis not present

## 2022-07-15 DIAGNOSIS — R7302 Impaired glucose tolerance (oral): Secondary | ICD-10-CM | POA: Diagnosis not present

## 2022-07-15 DIAGNOSIS — K219 Gastro-esophageal reflux disease without esophagitis: Secondary | ICD-10-CM | POA: Diagnosis not present

## 2022-12-05 ENCOUNTER — Other Ambulatory Visit: Payer: Self-pay

## 2023-01-23 DIAGNOSIS — Z20822 Contact with and (suspected) exposure to covid-19: Secondary | ICD-10-CM | POA: Diagnosis not present

## 2023-01-23 DIAGNOSIS — J069 Acute upper respiratory infection, unspecified: Secondary | ICD-10-CM | POA: Diagnosis not present

## 2023-01-30 DIAGNOSIS — R42 Dizziness and giddiness: Secondary | ICD-10-CM | POA: Diagnosis not present

## 2023-01-30 DIAGNOSIS — J40 Bronchitis, not specified as acute or chronic: Secondary | ICD-10-CM | POA: Diagnosis not present

## 2023-01-30 DIAGNOSIS — R059 Cough, unspecified: Secondary | ICD-10-CM | POA: Diagnosis not present

## 2023-01-30 DIAGNOSIS — R69 Illness, unspecified: Secondary | ICD-10-CM | POA: Diagnosis not present

## 2023-01-30 DIAGNOSIS — R110 Nausea: Secondary | ICD-10-CM | POA: Diagnosis not present

## 2023-01-30 DIAGNOSIS — W0110XA Fall on same level from slipping, tripping and stumbling with subsequent striking against unspecified object, initial encounter: Secondary | ICD-10-CM | POA: Diagnosis not present

## 2023-01-30 DIAGNOSIS — S0990XA Unspecified injury of head, initial encounter: Secondary | ICD-10-CM | POA: Diagnosis not present

## 2023-01-30 DIAGNOSIS — S0003XA Contusion of scalp, initial encounter: Secondary | ICD-10-CM | POA: Diagnosis not present

## 2023-01-30 DIAGNOSIS — W19XXXA Unspecified fall, initial encounter: Secondary | ICD-10-CM | POA: Diagnosis not present

## 2023-01-30 DIAGNOSIS — I10 Essential (primary) hypertension: Secondary | ICD-10-CM | POA: Diagnosis not present

## 2023-02-15 DIAGNOSIS — Z20822 Contact with and (suspected) exposure to covid-19: Secondary | ICD-10-CM | POA: Diagnosis not present

## 2023-02-15 DIAGNOSIS — R519 Headache, unspecified: Secondary | ICD-10-CM | POA: Diagnosis not present

## 2023-02-15 DIAGNOSIS — I10 Essential (primary) hypertension: Secondary | ICD-10-CM | POA: Diagnosis not present

## 2023-02-15 DIAGNOSIS — R918 Other nonspecific abnormal finding of lung field: Secondary | ICD-10-CM | POA: Diagnosis not present

## 2023-02-15 DIAGNOSIS — J309 Allergic rhinitis, unspecified: Secondary | ICD-10-CM | POA: Diagnosis not present

## 2023-03-30 ENCOUNTER — Ambulatory Visit: Payer: Medicare HMO | Admitting: Internal Medicine

## 2023-04-25 ENCOUNTER — Encounter: Payer: Self-pay | Admitting: Internal Medicine

## 2023-04-25 ENCOUNTER — Ambulatory Visit (INDEPENDENT_AMBULATORY_CARE_PROVIDER_SITE_OTHER): Payer: Medicare HMO | Admitting: Internal Medicine

## 2023-04-25 VITALS — BP 130/80 | HR 72 | Ht 63.0 in | Wt 157.8 lb

## 2023-04-25 DIAGNOSIS — F411 Generalized anxiety disorder: Secondary | ICD-10-CM

## 2023-04-25 DIAGNOSIS — R2689 Other abnormalities of gait and mobility: Secondary | ICD-10-CM | POA: Diagnosis not present

## 2023-04-25 DIAGNOSIS — E038 Other specified hypothyroidism: Secondary | ICD-10-CM

## 2023-04-25 DIAGNOSIS — E782 Mixed hyperlipidemia: Secondary | ICD-10-CM | POA: Diagnosis not present

## 2023-04-25 DIAGNOSIS — R7303 Prediabetes: Secondary | ICD-10-CM

## 2023-04-25 DIAGNOSIS — I10 Essential (primary) hypertension: Secondary | ICD-10-CM

## 2023-04-25 MED ORDER — DULOXETINE HCL 20 MG PO CPEP
20.0000 mg | ORAL_CAPSULE | Freq: Every day | ORAL | 2 refills | Status: DC
Start: 2023-04-25 — End: 2023-05-18

## 2023-04-25 NOTE — Progress Notes (Signed)
Established Patient Office Visit  Subjective:  Patient ID: Hannah Rubio, female    DOB: 01/15/1951  Age: 72 y.o. MRN: [id number]  Chief Complaint  Patient presents with   Acute Visit    Body pain    Patient comes in for follow up. Needs labs as well. Recently fell while traveling in [place]. Evaluated and discharged from ED . C/o chronic fatigue , also has fibromyalgia. Agrees to try small dose Cymbalta. Will also set  up PT . Needs referral to eye doctor.    No other concerns at this time.   Past Medical History:  Diagnosis Date   Essential hypertension 10/01/2019   Hypertension    Hypothyroidism 10/01/2019   Overweight with body mass index (BMI) of 27 to 27.9 in adult 11/29/2019   Thyroid disease    Vaginal vault prolapse after hysterectomy 02/25/2016   Vitamin D deficiency 12/08/2019    Past Surgical History:  Procedure Laterality Date   ABDOMINAL HYSTERECTOMY      Social History   Socioeconomic History   Marital status: Widowed    Spouse name: Not on file   Number of children: Not on file   Years of education: Not on file   Highest education level: Not on file  Occupational History   Not on file  Tobacco Use   Smoking status: Never   Smokeless tobacco: Never  Substance and Sexual Activity   Alcohol use: No   Drug use: No   Sexual activity: Not on file  Other Topics Concern   Not on file  Social History Narrative   Not on file   Social Determinants of Health   Financial Resource Strain: Not on file  Food Insecurity: Not on file  Transportation Needs: Not on file  Physical Activity: Not on file  Stress: Not on file  Social Connections: Not on file  Intimate Partner Violence: Not on file    History reviewed. No pertinent family history.  No Known Allergies  Review of Systems  Constitutional:  Positive for malaise/fatigue. Negative for chills, diaphoresis, fever and weight loss.  HENT:  Negative for congestion, ear discharge, hearing  loss, nosebleeds, sinus pain, sore throat and tinnitus.   Eyes:  Positive for redness. Negative for blurred vision, double vision and photophobia.  Respiratory:  Negative for cough, shortness of breath, wheezing and stridor.   Cardiovascular:  Negative for chest pain, palpitations, orthopnea and leg swelling.  Gastrointestinal:  Positive for abdominal pain, constipation and diarrhea. Negative for heartburn and nausea.  Genitourinary:  Negative for dysuria, hematuria and urgency.  Musculoskeletal:  Positive for joint pain and myalgias. Negative for back pain and neck pain.  Skin: Negative.   Neurological:  Negative for dizziness, tingling, tremors, sensory change, speech change, focal weakness, weakness and headaches.  Psychiatric/Behavioral:  Positive for depression. Negative for substance abuse and suicidal ideas. The patient is nervous/anxious. The patient does not have insomnia.        Objective:   BP 130/80   Pulse 72   Ht 5\' 3"  (1.6 m)   Wt 157 lb 12.8 oz (71.6 kg)   SpO2 98%   BMI 27.95 kg/m   Vitals:   04/25/23 1314  BP: 130/80  Pulse: 72  Height: 5\' 3"  (1.6 m)  Weight: 157 lb 12.8 oz (71.6 kg)  SpO2: 98%  BMI (Calculated): 27.96    Physical Exam Vitals and nursing note reviewed.  Constitutional:      Appearance: Normal appearance.  HENT:  Mouth/Throat:     Mouth: Mucous membranes are moist.     Pharynx: No oropharyngeal exudate.  Eyes:     Conjunctiva/sclera: Conjunctivae normal.  Cardiovascular:     Rate and Rhythm: Normal rate and regular rhythm.     Pulses: Normal pulses.     Heart sounds: Normal heart sounds. No murmur heard. Pulmonary:     Effort: Pulmonary effort is normal.     Breath sounds: No wheezing, rhonchi or rales.  Abdominal:     General: Bowel sounds are normal.     Palpations: There is no mass.     Tenderness: There is no right CVA tenderness, left CVA tenderness, guarding or rebound.  Musculoskeletal:        General: No swelling,  deformity or signs of injury. Normal range of motion.     Cervical back: Normal range of motion and neck supple.     Right lower leg: No edema.  Neurological:     General: No focal deficit present.     Mental Status: She is alert and oriented to person, place, and time.  Psychiatric:        Mood and Affect: Mood normal.        Behavior: Behavior normal.      No results found for any visits on 04/25/23.  No results found for this or any previous visit (from the past 2160 hour(s)).    Assessment & Plan:  Check labs today. Start Cymbalta. PT for conditioning. Problem List Items Addressed This Visit     RESOLVED: Hypothyroidism   Relevant Medications   metoprolol tartrate (LOPRESSOR) 25 MG tablet   Other Relevant Orders   TSH+T4F+T3Free   Essential hypertension, benign - Primary   Relevant Medications   metoprolol tartrate (LOPRESSOR) 25 MG tablet   Other Relevant Orders   CBC With Differential   CMP14+EGFR   Ambulatory referral to Ophthalmology   GAD (generalized anxiety disorder)   Relevant Medications   DULoxetine (CYMBALTA) 20 MG capsule   Mixed hyperlipidemia   Relevant Medications   metoprolol tartrate (LOPRESSOR) 25 MG tablet   Other Relevant Orders   CMP14+EGFR   Lipid Panel w/o Chol/HDL Ratio   Other Visit Diagnoses     Prediabetes       Relevant Orders   CMP14+EGFR   Hemoglobin A1c   Ambulatory referral to Ophthalmology       Return in about 1 month (around 05/25/2023).   Total time spent: 30 minutes  Margaretann Loveless, MD  04/25/2023   This document may have been prepared by [facility] Voice Recognition software and as such may include unintentional dictation errors.

## 2023-04-26 DIAGNOSIS — R7303 Prediabetes: Secondary | ICD-10-CM | POA: Diagnosis not present

## 2023-04-26 DIAGNOSIS — I10 Essential (primary) hypertension: Secondary | ICD-10-CM | POA: Diagnosis not present

## 2023-04-26 DIAGNOSIS — E038 Other specified hypothyroidism: Secondary | ICD-10-CM | POA: Diagnosis not present

## 2023-04-26 DIAGNOSIS — E782 Mixed hyperlipidemia: Secondary | ICD-10-CM | POA: Diagnosis not present

## 2023-04-27 DIAGNOSIS — E785 Hyperlipidemia, unspecified: Secondary | ICD-10-CM | POA: Diagnosis not present

## 2023-04-27 DIAGNOSIS — Z9181 History of falling: Secondary | ICD-10-CM | POA: Diagnosis not present

## 2023-04-27 DIAGNOSIS — M1990 Unspecified osteoarthritis, unspecified site: Secondary | ICD-10-CM | POA: Diagnosis not present

## 2023-04-27 DIAGNOSIS — I129 Hypertensive chronic kidney disease with stage 1 through stage 4 chronic kidney disease, or unspecified chronic kidney disease: Secondary | ICD-10-CM | POA: Diagnosis not present

## 2023-04-27 DIAGNOSIS — Z008 Encounter for other general examination: Secondary | ICD-10-CM | POA: Diagnosis not present

## 2023-04-27 DIAGNOSIS — Z8249 Family history of ischemic heart disease and other diseases of the circulatory system: Secondary | ICD-10-CM | POA: Diagnosis not present

## 2023-04-27 DIAGNOSIS — N181 Chronic kidney disease, stage 1: Secondary | ICD-10-CM | POA: Diagnosis not present

## 2023-04-27 DIAGNOSIS — J301 Allergic rhinitis due to pollen: Secondary | ICD-10-CM | POA: Diagnosis not present

## 2023-04-27 DIAGNOSIS — Z79899 Other long term (current) drug therapy: Secondary | ICD-10-CM | POA: Diagnosis not present

## 2023-04-27 DIAGNOSIS — M797 Fibromyalgia: Secondary | ICD-10-CM | POA: Diagnosis not present

## 2023-04-27 DIAGNOSIS — E039 Hypothyroidism, unspecified: Secondary | ICD-10-CM | POA: Diagnosis not present

## 2023-04-27 LAB — CBC WITH DIFFERENTIAL
Basophils Absolute: 0 10*3/uL (ref 0.0–0.2)
Basos: 0 %
EOS (ABSOLUTE): 0.2 10*3/uL (ref 0.0–0.4)
Eos: 3 %
Hematocrit: 37.3 % (ref 34.0–46.6)
Hemoglobin: 12.4 g/dL (ref 11.1–15.9)
Immature Grans (Abs): 0 10*3/uL (ref 0.0–0.1)
Immature Granulocytes: 0 %
Lymphocytes Absolute: 2.5 10*3/uL (ref 0.7–3.1)
Lymphs: 43 %
MCH: 29.9 pg (ref 26.6–33.0)
MCHC: 33.2 g/dL (ref 31.5–35.7)
MCV: 90 fL (ref 79–97)
Monocytes Absolute: 0.3 10*3/uL (ref 0.1–0.9)
Monocytes: 6 %
Neutrophils Absolute: 2.8 10*3/uL (ref 1.4–7.0)
Neutrophils: 48 %
RBC: 4.15 x10E6/uL (ref 3.77–5.28)
RDW: 13.1 % (ref 11.7–15.4)
WBC: 5.8 10*3/uL (ref 3.4–10.8)

## 2023-04-27 LAB — CMP14+EGFR
ALT: 10 IU/L (ref 0–32)
AST: 19 IU/L (ref 0–40)
Albumin: 4.1 g/dL (ref 3.8–4.8)
Alkaline Phosphatase: 64 IU/L (ref 44–121)
BUN/Creatinine Ratio: 21 (ref 12–28)
BUN: 12 mg/dL (ref 8–27)
Bilirubin Total: 0.4 mg/dL (ref 0.0–1.2)
CO2: 21 mmol/L (ref 20–29)
Calcium: 9.1 mg/dL (ref 8.7–10.3)
Chloride: 107 mmol/L — ABNORMAL HIGH (ref 96–106)
Creatinine, Ser: 0.56 mg/dL — ABNORMAL LOW (ref 0.57–1.00)
Globulin, Total: 2.2 g/dL (ref 1.5–4.5)
Glucose: 96 mg/dL (ref 70–99)
Potassium: 4.4 mmol/L (ref 3.5–5.2)
Sodium: 143 mmol/L (ref 134–144)
Total Protein: 6.3 g/dL (ref 6.0–8.5)
eGFR: 96 mL/min/{1.73_m2} (ref >59)

## 2023-04-27 LAB — HEMOGLOBIN A1C
Est. average glucose Bld gHb Est-mCnc: 126 mg/dL
Hgb A1c MFr Bld: 6 % — ABNORMAL HIGH (ref 4.8–5.6)

## 2023-04-27 LAB — TSH+T4F+T3FREE
Free T4: 1.17 ng/dL (ref 0.82–1.77)
T3, Free: 2.5 pg/mL (ref 2.0–4.4)
TSH: 3.62 u[IU]/mL (ref 0.450–4.500)

## 2023-04-27 LAB — LIPID PANEL W/O CHOL/HDL RATIO
Cholesterol, Total: 182 mg/dL (ref 100–199)
HDL: 53 mg/dL (ref >39)
LDL Chol Calc (NIH): 110 mg/dL — ABNORMAL HIGH (ref 0–99)
Triglycerides: 105 mg/dL (ref 0–149)
VLDL Cholesterol Cal: 19 mg/dL (ref 5–40)

## 2023-05-11 ENCOUNTER — Ambulatory Visit: Payer: Medicare HMO | Admitting: Physical Therapy

## 2023-05-18 ENCOUNTER — Other Ambulatory Visit: Payer: Self-pay | Admitting: Internal Medicine

## 2023-05-18 DIAGNOSIS — F411 Generalized anxiety disorder: Secondary | ICD-10-CM

## 2023-05-25 ENCOUNTER — Ambulatory Visit: Payer: Medicare HMO | Admitting: Cardiology

## 2023-06-19 ENCOUNTER — Other Ambulatory Visit: Payer: Self-pay | Admitting: Internal Medicine

## 2023-06-20 DIAGNOSIS — H2513 Age-related nuclear cataract, bilateral: Secondary | ICD-10-CM | POA: Diagnosis not present

## 2023-06-20 DIAGNOSIS — H401134 Primary open-angle glaucoma, bilateral, indeterminate stage: Secondary | ICD-10-CM | POA: Diagnosis not present

## 2023-06-20 DIAGNOSIS — H35363 Drusen (degenerative) of macula, bilateral: Secondary | ICD-10-CM | POA: Diagnosis not present

## 2023-06-20 DIAGNOSIS — H25013 Cortical age-related cataract, bilateral: Secondary | ICD-10-CM | POA: Diagnosis not present

## 2023-06-20 DIAGNOSIS — H52203 Unspecified astigmatism, bilateral: Secondary | ICD-10-CM | POA: Diagnosis not present

## 2023-06-20 DIAGNOSIS — H353131 Nonexudative age-related macular degeneration, bilateral, early dry stage: Secondary | ICD-10-CM | POA: Diagnosis not present

## 2023-06-20 DIAGNOSIS — H524 Presbyopia: Secondary | ICD-10-CM | POA: Diagnosis not present

## 2023-06-20 DIAGNOSIS — H02834 Dermatochalasis of left upper eyelid: Secondary | ICD-10-CM | POA: Diagnosis not present

## 2023-06-20 DIAGNOSIS — H5203 Hypermetropia, bilateral: Secondary | ICD-10-CM | POA: Diagnosis not present

## 2023-06-20 DIAGNOSIS — H3554 Dystrophies primarily involving the retinal pigment epithelium: Secondary | ICD-10-CM | POA: Diagnosis not present

## 2023-06-20 DIAGNOSIS — H02831 Dermatochalasis of right upper eyelid: Secondary | ICD-10-CM | POA: Diagnosis not present

## 2023-06-20 DIAGNOSIS — H43822 Vitreomacular adhesion, left eye: Secondary | ICD-10-CM | POA: Diagnosis not present

## 2023-06-29 ENCOUNTER — Encounter: Payer: Self-pay | Admitting: Internal Medicine

## 2023-06-29 ENCOUNTER — Ambulatory Visit (INDEPENDENT_AMBULATORY_CARE_PROVIDER_SITE_OTHER): Payer: Medicare HMO | Admitting: Internal Medicine

## 2023-06-29 VITALS — BP 120/84 | HR 70 | Ht 63.0 in | Wt 152.4 lb

## 2023-06-29 DIAGNOSIS — G9332 Myalgic encephalomyelitis/chronic fatigue syndrome: Secondary | ICD-10-CM | POA: Diagnosis not present

## 2023-06-29 DIAGNOSIS — E038 Other specified hypothyroidism: Secondary | ICD-10-CM | POA: Diagnosis not present

## 2023-06-29 DIAGNOSIS — F411 Generalized anxiety disorder: Secondary | ICD-10-CM

## 2023-06-29 DIAGNOSIS — I10 Essential (primary) hypertension: Secondary | ICD-10-CM

## 2023-06-29 DIAGNOSIS — M797 Fibromyalgia: Secondary | ICD-10-CM

## 2023-06-29 DIAGNOSIS — R5383 Other fatigue: Secondary | ICD-10-CM | POA: Insufficient documentation

## 2023-06-29 DIAGNOSIS — E782 Mixed hyperlipidemia: Secondary | ICD-10-CM

## 2023-06-29 MED ORDER — QUETIAPINE FUMARATE 25 MG PO TABS
25.0000 mg | ORAL_TABLET | Freq: Every day | ORAL | 3 refills | Status: DC
Start: 2023-06-29 — End: 2023-07-24

## 2023-06-29 NOTE — Progress Notes (Signed)
Established Patient Office Visit  Subjective:  Patient ID: Hannah Rubio, female    DOB: [birth year]  Age: 72 y.o. MRN: [id number]  Chief Complaint  Patient presents with   Follow-up    Follow up    Patient comes in today with complaints of feeling tired and low energy.  Patient has history of chronic fatigue and fibromyalgia.  She also suffers from anxiety and depression but has been reluctant to use medications in the past.  At her last visit she was prescribed Cymbalta but she only took it for a month and thinks she had some itching after she took the pills.  Her labs done at the last visit were also stable.  She agrees that she feels down especially since her husband passed away 17 years ago. She was recently diagnosed with glaucoma and is using eyedrops. She has difficulty sleeping at night as as her thoughts keep her awake.  She does live independently at home but her children live close by.  She is trying to control her diet and has intentionally lost a few pounds, Denies chest pain or palpitations.  No nausea vomiting or diarrhea.  Appetite is stable. Today she agrees to try a small dose of Seroquel 25 mg at bedtime.      No other concerns at this time.   Past Medical History:  Diagnosis Date   Essential hypertension 10/01/2019   Hypertension    Hypothyroidism 10/01/2019   Overweight with body mass index (BMI) of 27 to 27.9 in adult 11/29/2019   Thyroid disease    Vaginal vault prolapse after hysterectomy 02/25/2016   Vitamin D deficiency 12/08/2019    Past Surgical History:  Procedure Laterality Date   ABDOMINAL HYSTERECTOMY      Social History   Socioeconomic History   Marital status: Widowed    Spouse name: Not on file   Number of children: Not on file   Years of education: Not on file   Highest education level: Not on file  Occupational History   Not on file  Tobacco Use   Smoking status: Never   Smokeless tobacco: Never  Substance and Sexual Activity    Alcohol use: No   Drug use: No   Sexual activity: Not on file  Other Topics Concern   Not on file  Social History Narrative   Not on file   Social Determinants of Health   Financial Resource Strain: Not on file  Food Insecurity: Not on file  Transportation Needs: Not on file  Physical Activity: Not on file  Stress: Not on file  Social Connections: Not on file  Intimate Partner Violence: Not on file    History reviewed. No pertinent family history.  No Known Allergies  Review of Systems  Constitutional:  Positive for malaise/fatigue.  HENT: Negative.    Eyes: Negative.   Respiratory: Negative.  Negative for cough and shortness of breath.   Cardiovascular: Negative.  Negative for chest pain, palpitations and leg swelling.  Gastrointestinal: Negative.  Negative for abdominal pain, constipation, diarrhea, heartburn, nausea and vomiting.  Genitourinary: Negative.  Negative for dysuria and flank pain.  Musculoskeletal: Negative.  Negative for joint pain and myalgias.  Skin: Negative.   Neurological: Negative.  Negative for dizziness and headaches.  Endo/Heme/Allergies: Negative.   Psychiatric/Behavioral: Negative.  Negative for depression and suicidal ideas. The patient is not nervous/anxious.        Objective:   BP 120/84   Pulse 70   Ht 5\' 3"  (  1.6 m)   Wt 152 lb 6.4 oz (69.1 kg)   SpO2 98%   BMI 27.00 kg/m   Vitals:   06/29/23 1353  BP: 120/84  Pulse: 70  Height: 5\' 3"  (1.6 m)  Weight: 152 lb 6.4 oz (69.1 kg)  SpO2: 98%  BMI (Calculated): 27    Physical Exam Vitals and nursing note reviewed.  Constitutional:      Appearance: Normal appearance.  HENT:     Head: Normocephalic and atraumatic.     Nose: Nose normal.     Mouth/Throat:     Mouth: Mucous membranes are moist.     Pharynx: Oropharynx is clear.  Eyes:     Conjunctiva/sclera: Conjunctivae normal.     Pupils: Pupils are equal, round, and reactive to light.  Cardiovascular:     Rate and  Rhythm: Normal rate and regular rhythm.     Pulses: Normal pulses.     Heart sounds: Normal heart sounds. No murmur heard. Pulmonary:     Effort: Pulmonary effort is normal.     Breath sounds: Normal breath sounds. No wheezing.  Abdominal:     General: Bowel sounds are normal.     Palpations: Abdomen is soft.     Tenderness: There is no abdominal tenderness. There is no right CVA tenderness or left CVA tenderness.  Musculoskeletal:        General: Normal range of motion.     Cervical back: Normal range of motion.     Right lower leg: No edema.     Left lower leg: No edema.  Skin:    General: Skin is warm and dry.  Neurological:     General: No focal deficit present.     Mental Status: She is alert and oriented to person, place, and time.  Psychiatric:        Mood and Affect: Mood normal.        Behavior: Behavior normal.      No results found for any visits on 06/29/23.      Assessment & Plan:  To continue all the current medications. Add Seroquel small dose at bedtime. Will check labs at follow-up. Problem List Items Addressed This Visit     Essential hypertension, benign   GAD (generalized anxiety disorder) - Primary   Relevant Medications   QUEtiapine (SEROQUEL) 25 MG tablet   Mixed hyperlipidemia   Chronic fatigue syndrome   Fibromyalgia   Other Visit Diagnoses     Other specified hypothyroidism           Return in about 4 weeks (around 07/27/2023).   Total time spent: 30 minutes  Margaretann Loveless, MD  06/29/2023   This document may have been prepared by [facility] Voice Recognition software and as such may include unintentional dictation errors.

## 2023-07-14 ENCOUNTER — Other Ambulatory Visit: Payer: Self-pay | Admitting: Internal Medicine

## 2023-07-16 DIAGNOSIS — Z0100 Encounter for examination of eyes and vision without abnormal findings: Secondary | ICD-10-CM | POA: Diagnosis not present

## 2023-07-17 ENCOUNTER — Other Ambulatory Visit: Payer: Self-pay | Admitting: Internal Medicine

## 2023-07-23 ENCOUNTER — Other Ambulatory Visit: Payer: Self-pay | Admitting: Internal Medicine

## 2023-07-23 DIAGNOSIS — F411 Generalized anxiety disorder: Secondary | ICD-10-CM

## 2023-07-27 ENCOUNTER — Ambulatory Visit: Payer: Medicare HMO | Admitting: Internal Medicine

## 2023-08-18 ENCOUNTER — Other Ambulatory Visit: Payer: Self-pay | Admitting: Internal Medicine

## 2023-09-22 ENCOUNTER — Other Ambulatory Visit: Payer: Self-pay | Admitting: Internal Medicine

## 2023-09-22 MED ORDER — LEVOTHYROXINE SODIUM 50 MCG PO TABS: 50.0000 ug | ORAL_TABLET | Freq: Every day | ORAL | 1 refills | Status: DC

## 2023-12-04 ENCOUNTER — Encounter: Payer: Self-pay | Admitting: Internal Medicine

## 2023-12-04 ENCOUNTER — Ambulatory Visit (INDEPENDENT_AMBULATORY_CARE_PROVIDER_SITE_OTHER): Payer: Medicare HMO | Admitting: Internal Medicine

## 2023-12-04 VITALS — BP 142/88 | HR 72 | Ht 63.0 in | Wt 153.6 lb

## 2023-12-04 DIAGNOSIS — I10 Essential (primary) hypertension: Secondary | ICD-10-CM

## 2023-12-04 DIAGNOSIS — M797 Fibromyalgia: Secondary | ICD-10-CM | POA: Diagnosis not present

## 2023-12-04 DIAGNOSIS — F411 Generalized anxiety disorder: Secondary | ICD-10-CM | POA: Diagnosis not present

## 2023-12-04 DIAGNOSIS — E038 Other specified hypothyroidism: Secondary | ICD-10-CM

## 2023-12-04 DIAGNOSIS — R5383 Other fatigue: Secondary | ICD-10-CM | POA: Diagnosis not present

## 2023-12-04 DIAGNOSIS — R7303 Prediabetes: Secondary | ICD-10-CM | POA: Insufficient documentation

## 2023-12-04 MED ORDER — QUETIAPINE FUMARATE 25 MG PO TABS: 25.0000 mg | ORAL_TABLET | Freq: Every day | ORAL | 3 refills | Status: DC

## 2023-12-04 MED ORDER — LEVOTHYROXINE SODIUM 50 MCG PO TABS: 50.0000 ug | ORAL_TABLET | Freq: Every day | ORAL | 1 refills | Status: DC

## 2023-12-04 MED ORDER — METOPROLOL TARTRATE 25 MG PO TABS: 25.0000 mg | ORAL_TABLET | Freq: Every day | ORAL | 3 refills | Status: DC

## 2023-12-04 MED ORDER — LOSARTAN POTASSIUM 50 MG PO TABS: 50.0000 mg | ORAL_TABLET | Freq: Every day | ORAL | 3 refills | Status: DC

## 2023-12-04 MED ORDER — GABAPENTIN 100 MG PO CAPS: 100.0000 mg | ORAL_CAPSULE | Freq: Every day | ORAL | 2 refills | Status: DC

## 2023-12-04 NOTE — Progress Notes (Signed)
Established Patient Office Visit  Subjective:  Patient ID: Hannah Rubio, female    DOB: 11-10-1950  Age: 73 y.o. MRN: [id number]  Chief Complaint  Patient presents with   Follow-up    Discuss medications    Patient comes in for her follow-up today.  She continues to have symptoms of her chronic fatigue, fibromyalgia and anxiety.  She is reluctant to take most of the medications.  Also having trouble sleeping at night, as a result is very tired during the daytime.  She was started on Seroquel which she took for the first 3 months but then ran out of the medicine.  She now recalls that it did help, and will pick up a refill which helped her rest well at night.  Also discussed gabapentin which will help with her fibromyalgia.  Patient needs blood work today along with some other medication refills. She has a history of mitral valve regurgitation in the past, needs an echocardiogram.    No other concerns at this time.   Past Medical History:  Diagnosis Date   Essential hypertension 10/01/2019   Hypertension    Hypothyroidism 10/01/2019   Overweight with body mass index (BMI) of 27 to 27.9 in adult 11/29/2019   Thyroid disease    Vaginal vault prolapse after hysterectomy 02/25/2016   Vitamin D deficiency 12/08/2019    Past Surgical History:  Procedure Laterality Date   ABDOMINAL HYSTERECTOMY      Social History   Socioeconomic History   Marital status: Widowed    Spouse name: Not on file   Number of children: Not on file   Years of education: Not on file   Highest education level: Not on file  Occupational History   Not on file  Tobacco Use   Smoking status: Never   Smokeless tobacco: Never  Substance and Sexual Activity   Alcohol use: No   Drug use: No   Sexual activity: Not on file  Other Topics Concern   Not on file  Social History Narrative   Not on file   Social Drivers of Health   Financial Resource Strain: Not on file  Food Insecurity: Not on file   Transportation Needs: Not on file  Physical Activity: Not on file  Stress: Not on file  Social Connections: Not on file  Intimate Partner Violence: Not on file    History reviewed. No pertinent family history.  No Known Allergies  Outpatient Medications Prior to Visit  Medication Sig   [DISCONTINUED] levothyroxine (SYNTHROID) 50 MCG tablet Take 1 tablet (50 mcg total) by mouth daily before breakfast.   [DISCONTINUED] losartan (COZAAR) 50 MG tablet TAKE 1 TABLET ONCE DAILY   [DISCONTINUED] metoprolol tartrate (LOPRESSOR) 25 MG tablet TALE ONE TABLET ONCE DAILY   fluticasone (FLONASE) 50 MCG/ACT nasal spray Place 2 sprays into both nostrils daily. (Patient not taking: Reported on 12/04/2023)   [DISCONTINUED] QUEtiapine (SEROQUEL) 25 MG tablet TAKE 1 TABLET BY MOUTH EVERYDAY AT BEDTIME (Patient not taking: Reported on 12/04/2023)   No facility-administered medications prior to visit.    Review of Systems  Constitutional: Negative.  Negative for chills, fever, malaise/fatigue and weight loss.  HENT: Negative.  Negative for ear discharge and sore throat.   Eyes: Negative.   Respiratory: Negative.  Negative for cough and shortness of breath.   Cardiovascular: Negative.  Negative for chest pain, palpitations and leg swelling.  Gastrointestinal: Negative.  Negative for abdominal pain, constipation, diarrhea, heartburn, nausea and vomiting.  Genitourinary: Negative.  Negative for dysuria and flank pain.  Musculoskeletal: Negative.  Negative for joint pain and myalgias.  Skin: Negative.   Neurological: Negative.  Negative for dizziness, tingling, tremors and headaches.  Endo/Heme/Allergies: Negative.   Psychiatric/Behavioral: Negative.  Negative for depression and suicidal ideas. The patient is not nervous/anxious.        Objective:   BP (!) 142/88   Pulse 72   Ht 5\' 3"  (1.6 m)   Wt 153 lb 9.6 oz (69.7 kg)   SpO2 99%   BMI 27.21 kg/m   Vitals:   12/04/23 1045  BP: (!) 142/88   Pulse: 72  Height: 5\' 3"  (1.6 m)  Weight: 153 lb 9.6 oz (69.7 kg)  SpO2: 99%  BMI (Calculated): 27.22    Physical Exam Vitals and nursing note reviewed.  Constitutional:      Appearance: Normal appearance.  HENT:     Head: Normocephalic and atraumatic.     Nose: Nose normal.     Mouth/Throat:     Mouth: Mucous membranes are moist.     Pharynx: Oropharynx is clear.  Eyes:     Conjunctiva/sclera: Conjunctivae normal.     Pupils: Pupils are equal, round, and reactive to light.  Cardiovascular:     Rate and Rhythm: Normal rate and regular rhythm.     Pulses: Normal pulses.     Heart sounds: Normal heart sounds. No murmur heard. Pulmonary:     Effort: Pulmonary effort is normal.     Breath sounds: Normal breath sounds. No wheezing.  Abdominal:     General: Bowel sounds are normal.     Palpations: Abdomen is soft.     Tenderness: There is no abdominal tenderness. There is no right CVA tenderness or left CVA tenderness.  Musculoskeletal:        General: Normal range of motion.     Cervical back: Normal range of motion.     Right lower leg: No edema.     Left lower leg: No edema.  Skin:    General: Skin is warm and dry.  Neurological:     General: No focal deficit present.     Mental Status: She is alert and oriented to person, place, and time.  Psychiatric:        Mood and Affect: Mood normal.        Behavior: Behavior normal.      No results found for any visits on 12/04/23.  No results found for this or any previous visit (from the past 2160 hours).    Assessment & Plan:  Refill medications.  Check labs today. Problem List Items Addressed This Visit     Other specified hypothyroidism   Relevant Medications   levothyroxine (SYNTHROID) 50 MCG tablet   metoprolol tartrate (LOPRESSOR) 25 MG tablet   Other Relevant Orders   TSH   Essential hypertension, benign - Primary   Relevant Medications   losartan (COZAAR) 50 MG tablet   metoprolol tartrate (LOPRESSOR)  25 MG tablet   Other Relevant Orders   CMP14+EGFR   PCV ECHOCARDIOGRAM COMPLETE   GAD (generalized anxiety disorder)   Relevant Medications   QUEtiapine (SEROQUEL) 25 MG tablet   Other fatigue   Relevant Orders   CBC with Diff   Fibromyalgia   Relevant Medications   gabapentin (NEURONTIN) 100 MG capsule   Prediabetes   Relevant Orders   Hemoglobin A1c    Return in about 1 month (around 01/01/2024).   Total time spent: 30 minutes  Margaretann Loveless, MD  12/04/2023   This document may have been prepared by [facility] Voice Recognition software and as such may include unintentional dictation errors.

## 2023-12-05 LAB — CMP14+EGFR
ALT: 13 [IU]/L (ref 0–32)
AST: 20 [IU]/L (ref 0–40)
Albumin: 4.3 g/dL (ref 3.8–4.8)
Alkaline Phosphatase: 76 [IU]/L (ref 44–121)
BUN/Creatinine Ratio: 23 (ref 12–28)
BUN: 15 mg/dL (ref 8–27)
Bilirubin Total: 0.3 mg/dL (ref 0.0–1.2)
CO2: 23 mmol/L (ref 20–29)
Calcium: 9.3 mg/dL (ref 8.7–10.3)
Chloride: 105 mmol/L (ref 96–106)
Creatinine, Ser: 0.65 mg/dL (ref 0.57–1.00)
Globulin, Total: 2.1 g/dL (ref 1.5–4.5)
Glucose: 104 mg/dL — ABNORMAL HIGH (ref 70–99)
Potassium: 4.4 mmol/L (ref 3.5–5.2)
Sodium: 142 mmol/L (ref 134–144)
Total Protein: 6.4 g/dL (ref 6.0–8.5)
eGFR: 92 mL/min/{1.73_m2} (ref >59)

## 2023-12-05 LAB — CBC WITH DIFFERENTIAL/PLATELET
Basophils Absolute: 0 10*3/uL (ref 0.0–0.2)
Basos: 1 %
EOS (ABSOLUTE): 0.2 10*3/uL (ref 0.0–0.4)
Eos: 2 %
Hematocrit: 38.5 % (ref 34.0–46.6)
Hemoglobin: 12.9 g/dL (ref 11.1–15.9)
Immature Grans (Abs): 0 10*3/uL (ref 0.0–0.1)
Immature Granulocytes: 0 %
Lymphocytes Absolute: 2.4 10*3/uL (ref 0.7–3.1)
Lymphs: 35 %
MCH: 30 pg (ref 26.6–33.0)
MCHC: 33.5 g/dL (ref 31.5–35.7)
MCV: 90 fL (ref 79–97)
Monocytes Absolute: 0.4 10*3/uL (ref 0.1–0.9)
Monocytes: 5 %
Neutrophils Absolute: 3.9 10*3/uL (ref 1.4–7.0)
Neutrophils: 57 %
Platelets: 250 10*3/uL (ref 150–450)
RBC: 4.3 x10E6/uL (ref 3.77–5.28)
RDW: 12.6 % (ref 11.7–15.4)
WBC: 6.8 10*3/uL (ref 3.4–10.8)

## 2023-12-05 LAB — HEMOGLOBIN A1C
Est. average glucose Bld gHb Est-mCnc: 123 mg/dL
Hgb A1c MFr Bld: 5.9 % — ABNORMAL HIGH (ref 4.8–5.6)

## 2023-12-05 LAB — TSH: TSH: 2.47 u[IU]/mL (ref 0.450–4.500)

## 2023-12-05 NOTE — Progress Notes (Signed)
 Patient notified

## 2023-12-12 ENCOUNTER — Other Ambulatory Visit: Payer: Medicare HMO

## 2023-12-26 ENCOUNTER — Ambulatory Visit: Payer: Medicare HMO | Admitting: Cardiology

## 2023-12-27 ENCOUNTER — Ambulatory Visit: Payer: Medicare HMO | Admitting: Cardiology

## 2024-01-04 ENCOUNTER — Ambulatory Visit: Payer: Medicare HMO | Admitting: Internal Medicine

## 2024-01-12 ENCOUNTER — Ambulatory Visit: Payer: Medicare HMO

## 2024-01-12 DIAGNOSIS — I361 Nonrheumatic tricuspid (valve) insufficiency: Secondary | ICD-10-CM

## 2024-01-12 DIAGNOSIS — I371 Nonrheumatic pulmonary valve insufficiency: Secondary | ICD-10-CM | POA: Diagnosis not present

## 2024-01-12 DIAGNOSIS — I340 Nonrheumatic mitral (valve) insufficiency: Secondary | ICD-10-CM

## 2024-01-12 DIAGNOSIS — I10 Essential (primary) hypertension: Secondary | ICD-10-CM

## 2024-01-12 DIAGNOSIS — I351 Nonrheumatic aortic (valve) insufficiency: Secondary | ICD-10-CM | POA: Diagnosis not present

## 2024-01-15 ENCOUNTER — Ambulatory Visit (INDEPENDENT_AMBULATORY_CARE_PROVIDER_SITE_OTHER): Admitting: Internal Medicine

## 2024-01-15 ENCOUNTER — Encounter: Payer: Self-pay | Admitting: Internal Medicine

## 2024-01-15 VITALS — BP 128/78 | HR 73 | Ht 63.0 in | Wt 153.0 lb

## 2024-01-15 DIAGNOSIS — G9332 Myalgic encephalomyelitis/chronic fatigue syndrome: Secondary | ICD-10-CM

## 2024-01-15 DIAGNOSIS — I10 Essential (primary) hypertension: Secondary | ICD-10-CM

## 2024-01-15 DIAGNOSIS — R7303 Prediabetes: Secondary | ICD-10-CM

## 2024-01-15 DIAGNOSIS — G4730 Sleep apnea, unspecified: Secondary | ICD-10-CM

## 2024-01-15 DIAGNOSIS — F411 Generalized anxiety disorder: Secondary | ICD-10-CM

## 2024-01-15 DIAGNOSIS — M797 Fibromyalgia: Secondary | ICD-10-CM | POA: Diagnosis not present

## 2024-01-15 NOTE — Progress Notes (Signed)
 Established Patient Office Visit  Subjective:  Patient ID: Hannah Rubio, female    DOB: [birth year]  Age: 73 y.o. MRN: [id number]  Chief Complaint  Patient presents with   Follow-up    1 month follow up, referral for sleep study.    Patient comes in for her follow-up accompanied by her son.  Reports of feeling tired, fatigued and still having difficulty sleeping at night.  Currently she is on Seroquel 25 mg at bedtime.  Gabapentin 100 mg was added at night to help with sleep and also for her fibromyalgia.  During the daytime she also feels tired.  She does not snore but the family suspects that she may have sleep apnea as causing her poor quality of sleep.  Will schedule a sleep study.  Patient can cut back on her gabapentin and see if it makes her feel less tired. She mentions an episode where she felt unsteady on her feet and took a few steps  backwards while carrying heavy weight in her hands and actually fell backwards but did not hit her head.  It only happened once.  Denies headache ,dizziness or chest pain, no shortness of breath or involuntary movements at that time.  It only happened once.    No other concerns at this time.   Past Medical History:  Diagnosis Date   Essential hypertension 10/01/2019   Hypertension    Hypothyroidism 10/01/2019   Overweight with body mass index (BMI) of 27 to 27.9 in adult 11/29/2019   Thyroid disease    Vaginal vault prolapse after hysterectomy 02/25/2016   Vitamin D deficiency 12/08/2019    Past Surgical History:  Procedure Laterality Date   ABDOMINAL HYSTERECTOMY      Social History   Socioeconomic History   Marital status: Widowed    Spouse name: Not on file   Number of children: Not on file   Years of education: Not on file   Highest education level: Not on file  Occupational History   Not on file  Tobacco Use   Smoking status: Never   Smokeless tobacco: Never  Substance and Sexual Activity   Alcohol use: No   Drug use:  No   Sexual activity: Not on file  Other Topics Concern   Not on file  Social History Narrative   Not on file   Social Drivers of Health   Financial Resource Strain: Not on file  Food Insecurity: Not on file  Transportation Needs: Not on file  Physical Activity: Not on file  Stress: Not on file  Social Connections: Not on file  Intimate Partner Violence: Not on file    History reviewed. No pertinent family history.  No Known Allergies  Outpatient Medications Prior to Visit  Medication Sig   gabapentin (NEURONTIN) 100 MG capsule Take 1 capsule (100 mg total) by mouth at bedtime.   levothyroxine (SYNTHROID) 50 MCG tablet Take 1 tablet (50 mcg total) by mouth daily before breakfast.   losartan (COZAAR) 50 MG tablet Take 1 tablet (50 mg total) by mouth daily.   metoprolol tartrate (LOPRESSOR) 25 MG tablet Take 1 tablet (25 mg total) by mouth daily.   QUEtiapine (SEROQUEL) 25 MG tablet Take 1 tablet (25 mg total) by mouth at bedtime.   fluticasone (FLONASE) 50 MCG/ACT nasal spray Place 2 sprays into both nostrils daily. (Patient not taking: Reported on 04/25/2023)   No facility-administered medications prior to visit.    Review of Systems  Constitutional: Negative.  Negative for  chills, fever, malaise/fatigue and weight loss.  HENT: Negative.  Negative for sore throat.   Eyes: Negative.   Respiratory: Negative.  Negative for cough and shortness of breath.   Cardiovascular: Negative.  Negative for chest pain, palpitations and leg swelling.  Gastrointestinal: Negative.  Negative for abdominal pain, constipation, diarrhea, heartburn, nausea and vomiting.  Genitourinary: Negative.  Negative for dysuria and flank pain.  Musculoskeletal: Negative.  Negative for joint pain and myalgias.  Skin: Negative.   Neurological: Negative.  Negative for dizziness, tingling, sensory change, speech change, focal weakness and headaches.  Endo/Heme/Allergies: Negative.   Psychiatric/Behavioral:  Negative.  Negative for depression and suicidal ideas. The patient is not nervous/anxious.        Objective:   BP 128/78   Pulse 73   Ht 5\' 3"  (1.6 m)   Wt 153 lb (69.4 kg)   SpO2 97%   BMI 27.10 kg/m   Vitals:   01/15/24 1111  BP: 128/78  Pulse: 73  Height: 5\' 3"  (1.6 m)  Weight: 153 lb (69.4 kg)  SpO2: 97%  BMI (Calculated): 27.11    Physical Exam Vitals and nursing note reviewed.  Constitutional:      Appearance: Normal appearance.  HENT:     Head: Normocephalic and atraumatic.     Nose: Nose normal.     Mouth/Throat:     Mouth: Mucous membranes are moist.     Pharynx: Oropharynx is clear.  Eyes:     Conjunctiva/sclera: Conjunctivae normal.     Pupils: Pupils are equal, round, and reactive to light.  Cardiovascular:     Rate and Rhythm: Normal rate and regular rhythm.     Pulses: Normal pulses.     Heart sounds: Normal heart sounds. No murmur heard. Pulmonary:     Effort: Pulmonary effort is normal.     Breath sounds: Normal breath sounds. No wheezing.  Abdominal:     General: Bowel sounds are normal.     Palpations: Abdomen is soft.     Tenderness: There is no abdominal tenderness. There is no right CVA tenderness or left CVA tenderness.  Musculoskeletal:        General: Normal range of motion.     Cervical back: Normal range of motion.     Right lower leg: No edema.     Left lower leg: No edema.  Skin:    General: Skin is warm and dry.  Neurological:     General: No focal deficit present.     Mental Status: She is alert and oriented to person, place, and time.  Psychiatric:        Mood and Affect: Mood normal.        Behavior: Behavior normal.      No results found for any visits on 01/15/24.  Recent Results (from the past 2160 hours)  CMP14+EGFR     Status: Abnormal   Collection Time: 12/04/23 11:23 AM  Result Value Ref Range   Glucose 104 (H) 70 - 99 mg/dL   BUN 15 8 - 27 mg/dL   Creatinine, Ser 1.61 0.57 - 1.00 mg/dL   eGFR 92 >09  UE/AVW/0.98   BUN/Creatinine Ratio 23 12 - 28   Sodium 142 134 - 144 mmol/L   Potassium 4.4 3.5 - 5.2 mmol/L   Chloride 105 96 - 106 mmol/L   CO2 23 20 - 29 mmol/L   Calcium 9.3 8.7 - 10.3 mg/dL   Total Protein 6.4 6.0 - 8.5 g/dL   Albumin  4.3 3.8 - 4.8 g/dL   Globulin, Total 2.1 1.5 - 4.5 g/dL   Bilirubin Total 0.3 0.0 - 1.2 mg/dL   Alkaline Phosphatase 76 44 - 121 IU/L   AST 20 0 - 40 IU/L   ALT 13 0 - 32 IU/L  TSH     Status: None   Collection Time: 12/04/23 11:23 AM  Result Value Ref Range   TSH 2.470 0.450 - 4.500 uIU/mL  CBC with Diff     Status: None   Collection Time: 12/04/23 11:23 AM  Result Value Ref Range   WBC 6.8 3.4 - 10.8 x10E3/uL   RBC 4.30 3.77 - 5.28 x10E6/uL   Hemoglobin 12.9 11.1 - 15.9 g/dL   Hematocrit 08.6 57.8 - 46.6 %   MCV 90 79 - 97 fL   MCH 30.0 26.6 - 33.0 pg   MCHC 33.5 31.5 - 35.7 g/dL   RDW 46.9 62.9 - 52.8 %   Platelets 250 150 - 450 x10E3/uL   Neutrophils 57 Not Estab. %   Lymphs 35 Not Estab. %   Monocytes 5 Not Estab. %   Eos 2 Not Estab. %   Basos 1 Not Estab. %   Neutrophils Absolute 3.9 1.4 - 7.0 x10E3/uL   Lymphocytes Absolute 2.4 0.7 - 3.1 x10E3/uL   Monocytes Absolute 0.4 0.1 - 0.9 x10E3/uL   EOS (ABSOLUTE) 0.2 0.0 - 0.4 x10E3/uL   Basophils Absolute 0.0 0.0 - 0.2 x10E3/uL   Immature Granulocytes 0 Not Estab. %   Immature Grans (Abs) 0.0 0.0 - 0.1 x10E3/uL  Hemoglobin A1c     Status: Abnormal   Collection Time: 12/04/23 11:23 AM  Result Value Ref Range   Hgb A1c MFr Bld 5.9 (H) 4.8 - 5.6 %    Comment:          Prediabetes: 5.7 - 6.4          Diabetes: >6.4          Glycemic control for adults with diabetes: <7.0    Est. average glucose Bld gHb Est-mCnc 123 mg/dL      Assessment & Plan:  Referral sent for sleep study.  Continue current medications.  Can stop gabapentin for now. Patient has been evaluated by rheumatologist in the past. May consider referral to psychologist. Problem List Items Addressed This Visit      Essential hypertension, benign - Primary   GAD (generalized anxiety disorder)   Fibromyalgia   Other Visit Diagnoses       Sleep apnea, unspecified type       Relevant Orders   Ambulatory referral to Pulmonology     Chronic fatigue syndrome           Return in about 3 months (around 04/16/2024).   Total time spent: 30 minutes  Margaretann Loveless, MD  01/15/2024   This document may have been prepared by [facility] Voice Recognition software and as such may include unintentional dictation errors.

## 2024-01-16 ENCOUNTER — Ambulatory Visit: Payer: Medicare HMO | Admitting: Internal Medicine

## 2024-01-31 DIAGNOSIS — G4709 Other insomnia: Secondary | ICD-10-CM | POA: Diagnosis not present

## 2024-01-31 DIAGNOSIS — G4733 Obstructive sleep apnea (adult) (pediatric): Secondary | ICD-10-CM | POA: Diagnosis not present

## 2024-03-04 ENCOUNTER — Telehealth: Payer: Self-pay | Admitting: Internal Medicine

## 2024-03-04 NOTE — Telephone Encounter (Signed)
 Patient's son left VM requesting a call back. Did not state what he needs.

## 2024-03-12 DIAGNOSIS — G4733 Obstructive sleep apnea (adult) (pediatric): Secondary | ICD-10-CM | POA: Diagnosis not present

## 2024-03-13 DIAGNOSIS — G4733 Obstructive sleep apnea (adult) (pediatric): Secondary | ICD-10-CM | POA: Diagnosis not present

## 2024-03-21 DIAGNOSIS — G4733 Obstructive sleep apnea (adult) (pediatric): Secondary | ICD-10-CM | POA: Diagnosis not present

## 2024-04-16 ENCOUNTER — Ambulatory Visit: Admitting: Internal Medicine

## 2024-04-29 ENCOUNTER — Telehealth: Payer: Self-pay | Admitting: Internal Medicine

## 2024-04-29 NOTE — Telephone Encounter (Signed)
 Patient's daughter called in stating she has been having n/v/d and having a lot of belching. She is experiencing discomfort in her stomach. She is currently in [facility] at the daughter's house and wants to know what to take OTC to help her. Please advise.

## 2024-05-29 ENCOUNTER — Other Ambulatory Visit: Payer: Self-pay | Admitting: Internal Medicine

## 2024-05-29 DIAGNOSIS — E038 Other specified hypothyroidism: Secondary | ICD-10-CM

## 2024-06-02 ENCOUNTER — Other Ambulatory Visit: Payer: Self-pay | Admitting: Internal Medicine

## 2024-06-02 DIAGNOSIS — F411 Generalized anxiety disorder: Secondary | ICD-10-CM

## 2024-08-11 DIAGNOSIS — M79672 Pain in left foot: Secondary | ICD-10-CM | POA: Diagnosis not present

## 2024-09-06 ENCOUNTER — Other Ambulatory Visit: Payer: Self-pay | Admitting: Cardiology

## 2024-09-06 DIAGNOSIS — E038 Other specified hypothyroidism: Secondary | ICD-10-CM

## 2024-10-10 ENCOUNTER — Encounter: Payer: Self-pay | Admitting: Internal Medicine

## 2024-10-10 ENCOUNTER — Ambulatory Visit: Admitting: Internal Medicine

## 2024-10-10 ENCOUNTER — Ambulatory Visit: Payer: Self-pay | Admitting: Internal Medicine

## 2024-10-10 VITALS — BP 132/78 | HR 69 | Ht 63.0 in | Wt 158.8 lb

## 2024-10-10 DIAGNOSIS — Z1389 Encounter for screening for other disorder: Secondary | ICD-10-CM | POA: Diagnosis not present

## 2024-10-10 DIAGNOSIS — G4733 Obstructive sleep apnea (adult) (pediatric): Secondary | ICD-10-CM | POA: Diagnosis not present

## 2024-10-10 DIAGNOSIS — I10 Essential (primary) hypertension: Secondary | ICD-10-CM | POA: Diagnosis not present

## 2024-10-10 DIAGNOSIS — R5383 Other fatigue: Secondary | ICD-10-CM | POA: Diagnosis not present

## 2024-10-10 DIAGNOSIS — E038 Other specified hypothyroidism: Secondary | ICD-10-CM | POA: Diagnosis not present

## 2024-10-10 DIAGNOSIS — M797 Fibromyalgia: Secondary | ICD-10-CM | POA: Diagnosis not present

## 2024-10-10 DIAGNOSIS — E782 Mixed hyperlipidemia: Secondary | ICD-10-CM | POA: Diagnosis not present

## 2024-10-10 DIAGNOSIS — F411 Generalized anxiety disorder: Secondary | ICD-10-CM | POA: Diagnosis not present

## 2024-10-10 DIAGNOSIS — R7303 Prediabetes: Secondary | ICD-10-CM | POA: Diagnosis not present

## 2024-10-10 LAB — POCT URINALYSIS DIPSTICK
Bilirubin, UA: NEGATIVE
Glucose, UA: NEGATIVE
Ketones, UA: NEGATIVE
Leukocytes, UA: NEGATIVE
Nitrite, UA: NEGATIVE
Protein, UA: NEGATIVE
Spec Grav, UA: <=1.005 — AB (ref 1.010–1.025)
Urobilinogen, UA: 0.2 U/dL
pH, UA: 6.5 (ref 5.0–8.0)

## 2024-10-10 MED ORDER — LOSARTAN POTASSIUM 50 MG PO TABS: 50.0000 mg | ORAL_TABLET | Freq: Every day | ORAL | 3 refills | Status: DC

## 2024-10-10 MED ORDER — METOPROLOL TARTRATE 25 MG PO TABS: 25.0000 mg | ORAL_TABLET | Freq: Every day | ORAL | 3 refills | Status: AC

## 2024-10-10 MED ORDER — GABAPENTIN 100 MG PO CAPS: 100.0000 mg | ORAL_CAPSULE | Freq: Every day | ORAL | 2 refills | Status: AC

## 2024-10-10 MED ORDER — QUETIAPINE FUMARATE 25 MG PO TABS: 25.0000 mg | ORAL_TABLET | Freq: Every day | ORAL | 3 refills | Status: AC

## 2024-10-10 NOTE — Progress Notes (Signed)
 Established Patient Office Visit  Subjective:  Patient ID: Hannah Rubio, female    DOB: [birth year]  Age: 73 y.o. MRN: [id number]  Chief Complaint  Patient presents with   Extremity Weakness    Patient comes in with her son today. Reports of feeling tired, no energy , aches and pains. Patient has chronic Fibromyalgia and chronic fatigue syndrome.  Patient also has history of generalized anxiety disorder and chronic insomnia.  She has been prescribed Seroquel  25 mg to help with her sleep and gabapentin  to help with her fibromyalgia, but she reports that she stopped taking both of them.  Recently she was diagnosed with mild obstructive sleep apnea and auto CPAP with nasal prongs was prescribed.  She only used it once and did not like it and the machine was returned.  Today after discussion she was advised to give the CPAP machine and another try, while using a comfortable mask, and then resume her Seroquel  and gabapentin  for a deep restful night sleep. Denies headaches or dizziness, no chest pain or palpitations, no abdominal pain, no diarrhea or constipation.  However she mentions increased urination and sometimes feels poor bladder control.  Will check a urine dipstick today.  She is also due for blood work.    No other concerns at this time.   Past Medical History:  Diagnosis Date   Essential hypertension 10/01/2019   Hypertension    Hypothyroidism 10/01/2019   Overweight with body mass index (BMI) of 27 to 27.9 in adult 11/29/2019   Thyroid  disease    Vaginal vault prolapse after hysterectomy 02/25/2016   Vitamin D deficiency 12/08/2019    Past Surgical History:  Procedure Laterality Date   ABDOMINAL HYSTERECTOMY      Social History   Socioeconomic History   Marital status: Widowed    Spouse name: Not on file   Number of children: Not on file   Years of education: Not on file   Highest education level: Not on file  Occupational History   Not on file  Tobacco Use    Smoking status: Never   Smokeless tobacco: Never  Substance and Sexual Activity   Alcohol use: No   Drug use: No   Sexual activity: Not on file  Other Topics Concern   Not on file  Social History Narrative   Not on file   Social Drivers of Health   Tobacco Use: Low Risk (10/10/2024)   Patient History    Smoking Tobacco Use: Never    Smokeless Tobacco Use: Never    Passive Exposure: Not on file  Financial Resource Strain: Not on file  Food Insecurity: Not on file  Transportation Needs: Not on file  Physical Activity: Not on file  Stress: Not on file  Social Connections: Not on file  Intimate Partner Violence: Not on file  Depression (EYV7-0): Medium Risk (01/15/2024)   Depression (PHQ2-9)    PHQ-2 Score: 8  Alcohol Screen: Not on file  Housing: Not on file  Utilities: Not on file  Health Literacy: Not on file    History reviewed. No pertinent family history.  Allergies[1]  Show/hide medication list[2]  Review of Systems  Constitutional:  Positive for malaise/fatigue. Negative for chills and fever.  HENT: Negative.  Negative for congestion and sore throat.   Eyes: Negative.  Negative for blurred vision and pain.  Respiratory: Negative.  Negative for cough and shortness of breath.   Cardiovascular: Negative.  Negative for chest pain, palpitations and leg swelling.  Gastrointestinal: Negative.  Negative for abdominal pain, blood in stool, constipation, diarrhea, heartburn, melena, nausea and vomiting.  Genitourinary: Negative.  Negative for dysuria, flank pain, frequency and urgency.  Musculoskeletal:  Positive for myalgias. Negative for joint pain.  Skin: Negative.   Neurological: Negative.  Negative for dizziness, tingling, sensory change, weakness and headaches.  Endo/Heme/Allergies: Negative.   Psychiatric/Behavioral:  Negative for depression and suicidal ideas. The patient is nervous/anxious and has insomnia.        Objective:   BP 132/78   Pulse 69   Ht  5' 3 (1.6 m)   Wt 158 lb 12.8 oz (72 kg)   SpO2 99%   BMI 28.13 kg/m   Vitals:   10/10/24 1504  BP: 132/78  Pulse: 69  Height: 5' 3 (1.6 m)  Weight: 158 lb 12.8 oz (72 kg)  SpO2: 99%  BMI (Calculated): 28.14    Physical Exam Vitals and nursing note reviewed.  Constitutional:      Appearance: Normal appearance.  HENT:     Head: Normocephalic and atraumatic.     Nose: Nose normal.     Mouth/Throat:     Mouth: Mucous membranes are moist.     Pharynx: Oropharynx is clear.  Eyes:     Conjunctiva/sclera: Conjunctivae normal.     Pupils: Pupils are equal, round, and reactive to light.  Cardiovascular:     Rate and Rhythm: Normal rate and regular rhythm.     Pulses: Normal pulses.     Heart sounds: Normal heart sounds. No murmur heard. Pulmonary:     Effort: Pulmonary effort is normal.     Breath sounds: Normal breath sounds. No wheezing.  Abdominal:     General: Bowel sounds are normal.     Palpations: Abdomen is soft.     Tenderness: There is no abdominal tenderness. There is no right CVA tenderness or left CVA tenderness.  Musculoskeletal:        General: Normal range of motion.     Cervical back: Normal range of motion.     Right lower leg: No edema.     Left lower leg: No edema.  Skin:    General: Skin is warm and dry.  Neurological:     General: No focal deficit present.     Mental Status: She is alert and oriented to person, place, and time.  Psychiatric:        Mood and Affect: Mood normal.        Behavior: Behavior normal.      Results for orders placed or performed in visit on 10/10/24  POCT Urinalysis Dipstick (81002)  Result Value Ref Range   Color, UA Light yellow    Clarity, UA Clear    Glucose, UA Negative Negative   Bilirubin, UA Negative    Ketones, UA Negative    Spec Grav, UA <=1.005 (A) 1.010 - 1.025   Blood, UA Trace    pH, UA 6.5 5.0 - 8.0   Protein, UA Negative Negative   Urobilinogen, UA 0.2 0.2 or 1.0 E.U./dL   Nitrite, UA  Negative    Leukocytes, UA Negative Negative   Appearance Clear    Odor No     Recent Results (from the past 2160 hours)  POCT Urinalysis Dipstick (18997)     Status: Abnormal   Collection Time: 10/10/24  3:39 PM  Result Value Ref Range   Color, UA Light yellow    Clarity, UA Clear    Glucose, UA Negative  Negative   Bilirubin, UA Negative    Ketones, UA Negative    Spec Grav, UA <=1.005 (A) 1.010 - 1.025   Blood, UA Trace    pH, UA 6.5 5.0 - 8.0   Protein, UA Negative Negative   Urobilinogen, UA 0.2 0.2 or 1.0 E.U./dL   Nitrite, UA Negative    Leukocytes, UA Negative Negative   Appearance Clear    Odor No       Assessment & Plan:  Advised to take her meds as prescribed. Labs today. Resume CPAP. Problem List Items Addressed This Visit       Cardiovascular and Mediastinum   Essential hypertension, benign   Relevant Medications   metoprolol  tartrate (LOPRESSOR ) 25 MG tablet   losartan  (COZAAR ) 50 MG tablet   Other Relevant Orders   CMP14+EGFR     Respiratory   OSA (obstructive sleep apnea)     Endocrine   Other specified hypothyroidism   Relevant Medications   metoprolol  tartrate (LOPRESSOR ) 25 MG tablet   Other Relevant Orders   TSH+T4F+T3Free     Other   GAD (generalized anxiety disorder)   Relevant Medications   QUEtiapine  (SEROQUEL ) 25 MG tablet   Mixed hyperlipidemia   Relevant Medications   metoprolol  tartrate (LOPRESSOR ) 25 MG tablet   losartan  (COZAAR ) 50 MG tablet   Other Relevant Orders   Lipid Panel w/o Chol/HDL Ratio   Other fatigue   Relevant Orders   CBC with Diff   Fibromyalgia - Primary   Relevant Medications   gabapentin  (NEURONTIN ) 100 MG capsule   Prediabetes   Relevant Orders   Hemoglobin A1c   Other Visit Diagnoses       Screening for blood or protein in urine       Relevant Orders   POCT Urinalysis Dipstick (81002) (Completed)       Return in about 6 weeks (around 11/21/2024).   Total time spent: 30 minutes. This  time includes review of previous notes and results and patient face to face interaction during today's visit.    FERNAND FREDY RAMAN, MD  10/10/2024   This document may have been prepared by [facility] Voice Recognition software and as such may include unintentional dictation errors.     [1] No Known Allergies [2]  Outpatient Medications Prior to Visit  Medication Sig   levothyroxine  (SYNTHROID ) 50 MCG tablet TAKE 1 TABLET BY MOUTH EVERY DAY BEFORE BREAKFAST   [DISCONTINUED] losartan  (COZAAR ) 50 MG tablet Take 1 tablet (50 mg total) by mouth daily.   [DISCONTINUED] metoprolol  tartrate (LOPRESSOR ) 25 MG tablet Take 1 tablet (25 mg total) by mouth daily.   fluticasone (FLONASE) 50 MCG/ACT nasal spray Place 2 sprays into both nostrils daily. (Patient not taking: Reported on 10/10/2024)   [DISCONTINUED] gabapentin  (NEURONTIN ) 100 MG capsule Take 1 capsule (100 mg total) by mouth at bedtime. (Patient not taking: Reported on 10/10/2024)   [DISCONTINUED] QUEtiapine  (SEROQUEL ) 25 MG tablet Take 1 tablet (25 mg total) by mouth at bedtime. (Patient not taking: Reported on 10/10/2024)   No facility-administered medications prior to visit.

## 2024-10-11 DIAGNOSIS — I10 Essential (primary) hypertension: Secondary | ICD-10-CM | POA: Diagnosis not present

## 2024-10-11 DIAGNOSIS — R7303 Prediabetes: Secondary | ICD-10-CM | POA: Diagnosis not present

## 2024-10-11 DIAGNOSIS — E038 Other specified hypothyroidism: Secondary | ICD-10-CM | POA: Diagnosis not present

## 2024-10-11 DIAGNOSIS — R5383 Other fatigue: Secondary | ICD-10-CM | POA: Diagnosis not present

## 2024-10-11 DIAGNOSIS — E782 Mixed hyperlipidemia: Secondary | ICD-10-CM | POA: Diagnosis not present

## 2024-10-12 LAB — LIPID PANEL W/O CHOL/HDL RATIO
Cholesterol, Total: 212 mg/dL — ABNORMAL HIGH (ref 100–199)
HDL: 63 mg/dL (ref >39)
LDL Chol Calc (NIH): 113 mg/dL — ABNORMAL HIGH (ref 0–99)
Triglycerides: 212 mg/dL — ABNORMAL HIGH (ref 0–149)
VLDL Cholesterol Cal: 36 mg/dL (ref 5–40)

## 2024-10-12 LAB — CBC WITH DIFFERENTIAL/PLATELET
Basophils Absolute: 0.1 x10E3/uL (ref 0.0–0.2)
Basos: 1 %
EOS (ABSOLUTE): 0.1 x10E3/uL (ref 0.0–0.4)
Eos: 2 %
Hematocrit: 40.4 % (ref 34.0–46.6)
Hemoglobin: 13.1 g/dL (ref 11.1–15.9)
Immature Grans (Abs): 0 x10E3/uL (ref 0.0–0.1)
Immature Granulocytes: 0 %
Lymphocytes Absolute: 2.7 x10E3/uL (ref 0.7–3.1)
Lymphs: 37 %
MCH: 29.6 pg (ref 26.6–33.0)
MCHC: 32.4 g/dL (ref 31.5–35.7)
MCV: 91 fL (ref 79–97)
Monocytes Absolute: 0.4 x10E3/uL (ref 0.1–0.9)
Monocytes: 5 %
Neutrophils Absolute: 4 x10E3/uL (ref 1.4–7.0)
Neutrophils: 55 %
Platelets: 275 x10E3/uL (ref 150–450)
RBC: 4.43 x10E6/uL (ref 3.77–5.28)
RDW: 13.2 % (ref 11.7–15.4)
WBC: 7.2 x10E3/uL (ref 3.4–10.8)

## 2024-10-12 LAB — CMP14+EGFR
ALT: 13 IU/L (ref 0–32)
AST: 27 IU/L (ref 0–40)
Albumin: 4.7 g/dL (ref 3.8–4.8)
Alkaline Phosphatase: 73 IU/L (ref 49–135)
BUN/Creatinine Ratio: 19 (ref 12–28)
BUN: 14 mg/dL (ref 8–27)
Bilirubin Total: 0.4 mg/dL (ref 0.0–1.2)
CO2: 22 mmol/L (ref 20–29)
Calcium: 9.7 mg/dL (ref 8.7–10.3)
Chloride: 101 mmol/L (ref 96–106)
Creatinine, Ser: 0.74 mg/dL (ref 0.57–1.00)
Globulin, Total: 2.4 g/dL (ref 1.5–4.5)
Glucose: 92 mg/dL (ref 70–99)
Potassium: 4.9 mmol/L (ref 3.5–5.2)
Sodium: 138 mmol/L (ref 134–144)
Total Protein: 7.1 g/dL (ref 6.0–8.5)
eGFR: 84 mL/min/1.73 (ref >59)

## 2024-10-12 LAB — HEMOGLOBIN A1C
Est. average glucose Bld gHb Est-mCnc: 120 mg/dL
Hgb A1c MFr Bld: 5.8 % — ABNORMAL HIGH (ref 4.8–5.6)

## 2024-10-12 LAB — TSH+T4F+T3FREE
Free T4: 1.27 ng/dL (ref 0.82–1.77)
T3, Free: 2.8 pg/mL (ref 2.0–4.4)
TSH: 1.43 u[IU]/mL (ref 0.450–4.500)

## 2024-10-14 ENCOUNTER — Other Ambulatory Visit: Payer: Self-pay

## 2024-10-14 DIAGNOSIS — E782 Mixed hyperlipidemia: Secondary | ICD-10-CM

## 2024-10-14 MED ORDER — ROSUVASTATIN CALCIUM 10 MG PO TABS: 10.0000 mg | ORAL_TABLET | Freq: Every day | ORAL | 3 refills | Status: AC

## 2024-10-14 MED ORDER — ROSUVASTATIN CALCIUM 10 MG PO TABS: 10.0000 mg | ORAL_TABLET | Freq: Every day | ORAL | 3 refills | Status: DC

## 2024-11-22 ENCOUNTER — Emergency Department (HOSPITAL_BASED_OUTPATIENT_CLINIC_OR_DEPARTMENT_OTHER)

## 2024-11-22 ENCOUNTER — Other Ambulatory Visit: Payer: Self-pay

## 2024-11-22 ENCOUNTER — Encounter (HOSPITAL_BASED_OUTPATIENT_CLINIC_OR_DEPARTMENT_OTHER): Payer: Self-pay

## 2024-11-22 ENCOUNTER — Emergency Department (HOSPITAL_BASED_OUTPATIENT_CLINIC_OR_DEPARTMENT_OTHER)
Admission: EM | Admit: 2024-11-22 | Discharge: 2024-11-22 | Disposition: A | Discharge status: Home / Self Care | Attending: Emergency Medicine | Admitting: Emergency Medicine

## 2024-11-22 DIAGNOSIS — E039 Hypothyroidism, unspecified: Secondary | ICD-10-CM | POA: Insufficient documentation

## 2024-11-22 DIAGNOSIS — I10 Essential (primary) hypertension: Secondary | ICD-10-CM | POA: Diagnosis not present

## 2024-11-22 DIAGNOSIS — Z79899 Other long term (current) drug therapy: Secondary | ICD-10-CM | POA: Insufficient documentation

## 2024-11-22 DIAGNOSIS — R030 Elevated blood-pressure reading, without diagnosis of hypertension: Secondary | ICD-10-CM

## 2024-11-22 DIAGNOSIS — R519 Headache, unspecified: Secondary | ICD-10-CM

## 2024-11-22 LAB — CBC WITH DIFFERENTIAL/PLATELET
Abs Immature Granulocytes: 0.01 K/uL (ref 0.00–0.07)
Basophils Absolute: 0 K/uL (ref 0.0–0.1)
Basophils Relative: 0 %
Eosinophils Absolute: 0.1 K/uL (ref 0.0–0.5)
Eosinophils Relative: 2 %
HCT: 35 % — ABNORMAL LOW (ref 36.0–46.0)
Hemoglobin: 12 g/dL (ref 12.0–15.0)
Immature Granulocytes: 0 %
Lymphocytes Relative: 38 %
Lymphs Abs: 2.5 K/uL (ref 0.7–4.0)
MCH: 30.2 pg (ref 26.0–34.0)
MCHC: 34.3 g/dL (ref 30.0–36.0)
MCV: 88.2 fL (ref 80.0–100.0)
Monocytes Absolute: 0.3 K/uL (ref 0.1–1.0)
Monocytes Relative: 5 %
Neutro Abs: 3.6 K/uL (ref 1.7–7.7)
Neutrophils Relative %: 55 %
Platelets: 205 K/uL (ref 150–400)
RBC: 3.97 MIL/uL (ref 3.87–5.11)
RDW: 13.2 % (ref 11.5–15.5)
WBC: 6.5 K/uL (ref 4.0–10.5)
nRBC: 0 % (ref 0.0–0.2)

## 2024-11-22 LAB — BASIC METABOLIC PANEL WITH GFR
Anion gap: 11 (ref 5–15)
BUN: 12 mg/dL (ref 8–23)
CO2: 25 mmol/L (ref 22–32)
Calcium: 10.1 mg/dL (ref 8.9–10.3)
Chloride: 104 mmol/L (ref 98–111)
Creatinine, Ser: 0.74 mg/dL (ref 0.44–1.00)
GFR, Estimated: >60 mL/min
Glucose, Bld: 103 mg/dL — ABNORMAL HIGH (ref 70–99)
Potassium: 3.8 mmol/L (ref 3.5–5.1)
Sodium: 140 mmol/L (ref 135–145)

## 2024-11-22 MED ORDER — ACETAMINOPHEN 500 MG PO TABS
1000.0000 mg | ORAL_TABLET | Freq: Once | ORAL | Status: AC
  Administered 2024-11-22: 1000 mg via ORAL
  Filled 2024-11-22: qty 2

## 2024-11-22 NOTE — ED Provider Notes (Signed)
 " [place] [facility] Provider Note   CSN: [id number] Arrival date & time: 11/22/24  2115     Patient presents with: Headache and Hypertension   Hannah Rubio is a 74 y.o. female.   Patient is a 74 year old who presents with high blood pressure associated with a headache.  History is obtained from the patient and patient's son and daughter at bedside.  Patient does speak English although seems to have trouble with some words.  I offered to get an interpreter but patient and family refused.  Patient states that earlier this evening she developed a headache.  She describes it as bifrontal and bitemporal type headache.  She also has some pain in her face intermittently.  She denies any nausea or vomiting.  No numbness or weakness in her extremities.  No facial numbness.  No speech deficits or vision changes.  No chest pain or shortness of breath.  No sinus congestion.  No fevers.  When she started complaining of the headache, her daughter took her blood pressure and noted it was high at 180/90.  It stayed high and they brought her here.  Patient is currently complaining of a bifrontal type headache.  No current facial pain.  She takes losartan  and metoprolol  for her hypertension.  She says normally it runs pretty good.  She has not made any recent medication changes.       Prior to Admission medications  Medication Sig Start Date End Date Taking? Authorizing Provider  fluticasone (FLONASE) 50 MCG/ACT nasal spray Place 2 sprays into both nostrils daily. Patient not taking: Reported on 10/10/2024 02/24/22   [provider]  gabapentin  (NEURONTIN ) 100 MG capsule Take 1 capsule (100 mg total) by mouth at bedtime. 10/10/24 10/10/25  Fernand Fredy RAMAN, MD  levothyroxine  (SYNTHROID ) 50 MCG tablet TAKE 1 TABLET BY MOUTH EVERY DAY BEFORE BREAKFAST 09/06/24   Scoggins, Amber, NP  losartan  (COZAAR ) 50 MG tablet Take 1 tablet (50 mg total) by mouth daily.  10/10/24   Fernand Fredy RAMAN, MD  metoprolol  tartrate (LOPRESSOR ) 25 MG tablet Take 1 tablet (25 mg total) by mouth daily. 10/10/24 10/10/25  Fernand Fredy RAMAN, MD  QUEtiapine  (SEROQUEL ) 25 MG tablet Take 1 tablet (25 mg total) by mouth at bedtime. 10/10/24   Fernand Fredy RAMAN, MD  rosuvastatin  (CRESTOR ) 10 MG tablet Take 1 tablet (10 mg total) by mouth daily. 10/14/24   Fernand Fredy RAMAN, MD    Allergies: Patient has no known allergies.    Review of Systems  Constitutional:  Negative for chills, diaphoresis, fatigue and fever.  HENT:  Negative for congestion, facial swelling, rhinorrhea and sneezing.   Eyes: Negative.   Respiratory:  Negative for cough, chest tightness and shortness of breath.   Cardiovascular:  Negative for chest pain and leg swelling.  Gastrointestinal:  Negative for abdominal pain, diarrhea, nausea and vomiting.  Genitourinary:  Negative for difficulty urinating, flank pain and frequency.  Musculoskeletal:  Negative for arthralgias and back pain.  Skin:  Negative for rash.  Neurological:  Positive for headaches. Negative for dizziness, speech difficulty, weakness and numbness.    Updated Vital Signs BP (!) 160/73   Pulse 69   Temp 98.2 F (36.8 C) (Oral)   Resp 18   SpO2 100%   Physical Exam Constitutional:      Appearance: She is well-developed.  HENT:     Head: Normocephalic and atraumatic.     Mouth/Throat:     Comments: No  facial tenderness Eyes:     Pupils: Pupils are equal, round, and reactive to light.  Cardiovascular:     Rate and Rhythm: Normal rate and regular rhythm.     Heart sounds: Normal heart sounds.  Pulmonary:     Effort: Pulmonary effort is normal. No respiratory distress.     Breath sounds: Normal breath sounds. No wheezing or rales.  Chest:     Chest wall: No tenderness.  Abdominal:     General: Bowel sounds are normal.     Palpations: Abdomen is soft.     Tenderness: There is no abdominal tenderness. There is no guarding or rebound.   Musculoskeletal:        General: Normal range of motion.     Cervical back: Normal range of motion and neck supple.  Lymphadenopathy:     Cervical: No cervical adenopathy.  Skin:    General: Skin is warm and dry.     Findings: No rash.  Neurological:     Mental Status: She is alert and oriented to person, place, and time.     Comments: Motor 5/5 all extremities Sensation grossly intact to LT all extremities Finger to Nose intact, no pronator drift CN II-XII grossly intact       (all labs ordered are listed, but only abnormal results are displayed) Labs Reviewed  BASIC METABOLIC PANEL WITH GFR - Abnormal; Notable for the following components:      Result Value   Glucose, Bld 103 (*)    All other components within normal limits  CBC WITH DIFFERENTIAL/PLATELET - Abnormal; Notable for the following components:   HCT 35.0 (*)    All other components within normal limits    EKG: EKG Interpretation Date/Time:  Friday November 22 2024 21:58:28 EST Ventricular Rate:  72 PR Interval:  161 QRS Duration:  105 QT Interval:  390 QTC Calculation: 427 R Axis:   30  Text Interpretation: Sinus rhythm Low voltage, precordial leads since last tracing no significant change Confirmed by Lenor Hollering [phone]) on 11/22/2024 10:01:53 PM  Radiology: CT Head Wo Contrast Result Date: 11/22/2024 CLINICAL DATA:  Headache EXAM: CT HEAD WITHOUT CONTRAST TECHNIQUE: Contiguous axial images were obtained from the base of the skull through the vertex without intravenous contrast. RADIATION DOSE REDUCTION: This exam was performed according to the departmental dose-optimization program which includes automated exposure control, adjustment of the mA and/or kV according to patient size and/or use of iterative reconstruction technique. COMPARISON:  None Available. FINDINGS: Brain: No acute territorial infarction, hemorrhage or intracranial mass. The ventricles are nonenlarged Vascular: No hyperdense vessels.   Carotid vascular calcification Skull: Normal. Negative for fracture or focal lesion. Sinuses/Orbits: No acute finding. Other: None IMPRESSION: Negative non contrasted CT appearance of the brain. Electronically Signed   By: Luke Bun M.D.   On: 11/22/2024 22:41     Procedures   Medications Ordered in the ED  acetaminophen  (TYLENOL ) tablet 1,000 mg (1,000 mg Oral Given 11/22/24 2223)                                    Medical Decision Making Amount and/or Complexity of Data Reviewed Labs: ordered. Radiology: ordered.  Risk OTC drugs.   This patient presents to the ED for concern of high blood pressure, headache, this involves an extensive number of treatment options, and is a complaint that carries with it a high risk of complications and  morbidity.  I considered the following differential and admission for this acute, potentially life threatening condition.  The differential diagnosis includes tension headache, migraine, intracranial hemorrhage, stroke, hypertensive emergency, PRESS  MDM:    Patient is a 75 year old who presents with elevated blood pressure and headache.  Her blood pressure has improved since her initial 1 in triage.  It is currently 153/73.  She has a mild headache.  She does not have any neurologic deficits or signs of a stroke.  She does not have any chest pain or concerns for ACS.  Her EKG is nonconcerning.  Labs show normal creatinine, otherwise nonconcerning head CT does not show any acute abnormality.  No intracranial hemorrhage.  She is feeling better and is asymptomatic currently.  She was discharged home in good condition.  She has an appointment next week to follow-up with her PCP.  Return precautions were given.  (Labs, imaging, consults)  Labs: I Ordered, and personally interpreted labs.  The pertinent results include: Normal creatinine, no significant anemia  Imaging Studies ordered: I ordered imaging studies including head CT I independently  visualized and interpreted imaging. I agree with the radiologist interpretation  Additional history obtained from family at bedside.  External records from outside source obtained and reviewed including prior notes  Cardiac Monitoring: The patient was maintained on a cardiac monitor.  If on the cardiac monitor, I personally viewed and interpreted the cardiac monitored which showed an underlying rhythm of: Sinus rhythm  Reevaluation: After the interventions noted above, I reevaluated the patient and found that they have :improved  Social Determinants of Health:    Disposition: Discharged to home  Co morbidities that complicate the patient evaluation  Past Medical History:  Diagnosis Date   Essential hypertension 10/01/2019   Hypertension    Hypothyroidism 10/01/2019   Overweight with body mass index (BMI) of 27 to 27.9 in adult 11/29/2019   Thyroid  disease    Vaginal vault prolapse after hysterectomy 02/25/2016   Vitamin D deficiency 12/08/2019     Medicines Meds ordered this encounter  Medications   acetaminophen  (TYLENOL ) tablet 1,000 mg    I have reviewed the patients home medicines and have made adjustments as needed  Problem List / ED Course: Problem List Items Addressed This Visit   None Visit Diagnoses       Elevated blood pressure reading    -  Primary     Acute nonintractable headache, unspecified headache type       Relevant Medications   acetaminophen  (TYLENOL ) tablet 1,000 mg (Completed)                Final diagnoses:  Elevated blood pressure reading  Acute nonintractable headache, unspecified headache type    ED Discharge Orders     None          Lenor Hollering, MD 11/22/24 2259  "

## 2024-11-22 NOTE — ED Triage Notes (Signed)
 Pt presents via POV with daughter c/o HTN. Reports hx of HTN compliant with meds. Also reports headache. A&O x4.

## 2024-11-22 NOTE — Discharge Instructions (Signed)
 Follow-up with your doctor as scheduled next week.  Return to the emergency room if you have any worsening symptoms.

## 2024-11-29 ENCOUNTER — Ambulatory Visit: Admitting: Internal Medicine

## 2024-11-29 ENCOUNTER — Encounter: Payer: Self-pay | Admitting: Internal Medicine

## 2024-11-29 VITALS — BP 150/80 | HR 75 | Ht 63.0 in | Wt 159.0 lb

## 2024-11-29 DIAGNOSIS — F411 Generalized anxiety disorder: Secondary | ICD-10-CM

## 2024-11-29 DIAGNOSIS — E038 Other specified hypothyroidism: Secondary | ICD-10-CM

## 2024-11-29 DIAGNOSIS — J301 Allergic rhinitis due to pollen: Secondary | ICD-10-CM

## 2024-11-29 DIAGNOSIS — E782 Mixed hyperlipidemia: Secondary | ICD-10-CM

## 2024-11-29 DIAGNOSIS — R7303 Prediabetes: Secondary | ICD-10-CM

## 2024-11-29 DIAGNOSIS — I10 Essential (primary) hypertension: Secondary | ICD-10-CM

## 2024-11-29 MED ORDER — LEVOTHYROXINE SODIUM 50 MCG PO TABS: 50.0000 ug | ORAL_TABLET | Freq: Every day | ORAL | 3 refills | Status: AC

## 2024-11-29 MED ORDER — FLUTICASONE PROPIONATE 50 MCG/ACT NA SUSP: 2.0000 | Freq: Every day | NASAL | 3 refills | Status: AC

## 2024-11-29 MED ORDER — LOSARTAN POTASSIUM 100 MG PO TABS: 100.0000 mg | ORAL_TABLET | Freq: Every day | ORAL | 3 refills | Status: AC

## 2024-12-13 ENCOUNTER — Ambulatory Visit: Admitting: Internal Medicine
